# Patient Record
Sex: Male | Born: 1935 | Race: White | Hispanic: No | Marital: Married | Smoking: Former smoker
Health system: Southern US, Community
[De-identification: ages and names within clinical notes are randomized; demographics above are authoritative.]

## PROBLEM LIST (undated history)

## (undated) DIAGNOSIS — I4510 Unspecified right bundle-branch block: Secondary | ICD-10-CM

## (undated) DIAGNOSIS — G709 Myoneural disorder, unspecified: Secondary | ICD-10-CM

## (undated) DIAGNOSIS — I359 Nonrheumatic aortic valve disorder, unspecified: Secondary | ICD-10-CM

## (undated) DIAGNOSIS — F419 Anxiety disorder, unspecified: Secondary | ICD-10-CM

## (undated) DIAGNOSIS — I509 Heart failure, unspecified: Secondary | ICD-10-CM

## (undated) DIAGNOSIS — R0600 Dyspnea, unspecified: Secondary | ICD-10-CM

## (undated) DIAGNOSIS — E039 Hypothyroidism, unspecified: Secondary | ICD-10-CM

## (undated) DIAGNOSIS — K219 Gastro-esophageal reflux disease without esophagitis: Secondary | ICD-10-CM

## (undated) DIAGNOSIS — C801 Malignant (primary) neoplasm, unspecified: Secondary | ICD-10-CM

## (undated) DIAGNOSIS — G20 Parkinson's disease: Secondary | ICD-10-CM

## (undated) DIAGNOSIS — R0609 Other forms of dyspnea: Secondary | ICD-10-CM

## (undated) DIAGNOSIS — G20A1 Parkinson's disease without dyskinesia, without mention of fluctuations: Secondary | ICD-10-CM

## (undated) DIAGNOSIS — I10 Essential (primary) hypertension: Secondary | ICD-10-CM

## (undated) HISTORY — DX: Unspecified right bundle-branch block: I45.10

## (undated) HISTORY — DX: Nonrheumatic aortic valve disorder, unspecified: I35.9

## (undated) HISTORY — DX: Heart failure, unspecified: I50.9

## (undated) HISTORY — PX: PROSTATE SURGERY: SHX751

## (undated) HISTORY — DX: Other forms of dyspnea: R06.09

## (undated) HISTORY — PX: APPENDECTOMY: SHX54

## (undated) HISTORY — DX: Dyspnea, unspecified: R06.00

---

## 1998-07-25 ENCOUNTER — Other Ambulatory Visit: Admission: RE | Admit: 1998-07-25 | Discharge: 1998-07-25 | Payer: Self-pay | Admitting: Internal Medicine

## 2000-05-01 ENCOUNTER — Other Ambulatory Visit: Admission: RE | Admit: 2000-05-01 | Discharge: 2000-05-01 | Payer: Self-pay | Admitting: *Deleted

## 2001-04-14 ENCOUNTER — Encounter (INDEPENDENT_AMBULATORY_CARE_PROVIDER_SITE_OTHER): Payer: Self-pay | Admitting: *Deleted

## 2001-04-15 ENCOUNTER — Inpatient Hospital Stay (HOSPITAL_COMMUNITY): Admission: EM | Admit: 2001-04-15 | Discharge: 2001-04-22 | Payer: Self-pay | Admitting: Emergency Medicine

## 2001-04-15 ENCOUNTER — Encounter: Payer: Self-pay | Admitting: Emergency Medicine

## 2002-06-23 ENCOUNTER — Encounter: Payer: Self-pay | Admitting: Urology

## 2002-06-23 ENCOUNTER — Encounter: Admission: RE | Admit: 2002-06-23 | Discharge: 2002-06-23 | Payer: Self-pay | Admitting: Urology

## 2002-06-23 ENCOUNTER — Ambulatory Visit: Admission: RE | Admit: 2002-06-23 | Discharge: 2002-07-11 | Payer: Self-pay | Admitting: Radiation Oncology

## 2003-03-31 ENCOUNTER — Encounter: Payer: Self-pay | Admitting: Neurology

## 2003-03-31 ENCOUNTER — Encounter: Admission: RE | Admit: 2003-03-31 | Discharge: 2003-03-31 | Payer: Self-pay | Admitting: Neurology

## 2006-08-11 ENCOUNTER — Ambulatory Visit: Payer: Self-pay | Admitting: Internal Medicine

## 2007-10-19 ENCOUNTER — Ambulatory Visit (HOSPITAL_COMMUNITY): Admission: RE | Admit: 2007-10-19 | Discharge: 2007-10-19 | Payer: Self-pay | Admitting: Urology

## 2007-10-21 ENCOUNTER — Ambulatory Visit (HOSPITAL_COMMUNITY): Admission: RE | Admit: 2007-10-21 | Discharge: 2007-10-21 | Payer: Self-pay | Admitting: Urology

## 2007-10-29 ENCOUNTER — Ambulatory Visit: Admission: RE | Admit: 2007-10-29 | Discharge: 2007-12-02 | Payer: Self-pay | Admitting: Radiation Oncology

## 2008-03-21 ENCOUNTER — Ambulatory Visit: Admission: RE | Admit: 2008-03-21 | Discharge: 2008-06-04 | Payer: Self-pay | Admitting: Radiation Oncology

## 2008-07-28 ENCOUNTER — Encounter: Admission: RE | Admit: 2008-07-28 | Discharge: 2008-07-28 | Payer: Self-pay | Admitting: Sports Medicine

## 2010-02-06 ENCOUNTER — Emergency Department (HOSPITAL_COMMUNITY): Admission: EM | Admit: 2010-02-06 | Discharge: 2010-02-06 | Payer: Self-pay | Admitting: Emergency Medicine

## 2010-02-17 ENCOUNTER — Encounter: Admission: RE | Admit: 2010-02-17 | Discharge: 2010-02-17 | Payer: Self-pay | Admitting: Neurology

## 2010-08-27 HISTORY — PX: OTHER SURGICAL HISTORY: SHX169

## 2010-09-18 ENCOUNTER — Encounter
Admission: RE | Admit: 2010-09-18 | Discharge: 2010-09-18 | Payer: Self-pay | Source: Home / Self Care | Attending: Cardiovascular Disease | Admitting: Cardiovascular Disease

## 2010-12-17 LAB — BASIC METABOLIC PANEL
CO2: 26 mEq/L (ref 19–32)
Calcium: 9 mg/dL (ref 8.4–10.5)
Chloride: 102 mEq/L (ref 96–112)
GFR calc Af Amer: 56 mL/min — ABNORMAL LOW (ref >60)
GFR calc non Af Amer: 46 mL/min — ABNORMAL LOW (ref >60)
Sodium: 138 mEq/L (ref 135–145)

## 2010-12-17 LAB — DIFFERENTIAL
Basophils Relative: 1 % (ref 0–1)
Eosinophils Relative: 1 % (ref 0–5)
Monocytes Absolute: 0.3 10*3/uL (ref 0.1–1.0)
Neutro Abs: 4.6 10*3/uL (ref 1.7–7.7)
Neutrophils Relative %: 75 % (ref 43–77)

## 2010-12-17 LAB — POCT CARDIAC MARKERS
CKMB, poc: <1 ng/mL — ABNORMAL LOW (ref 1.0–8.0)
Myoglobin, poc: 84.4 ng/mL (ref 12–200)

## 2010-12-17 LAB — CBC
MCHC: 34.5 g/dL (ref 30.0–36.0)
MCV: 95.8 fL (ref 78.0–100.0)
WBC: 6.1 10*3/uL (ref 4.0–10.5)

## 2010-12-17 LAB — URINALYSIS, ROUTINE W REFLEX MICROSCOPIC: Urobilinogen, UA: 1 mg/dL (ref 0.0–1.0)

## 2011-02-14 NOTE — H&P (Signed)
[facility]  Patient:    Albert Pope, Albert Pope                     MRN: [id number] Adm. Date:  [id number] Attending:  Charlton Haws                         History and Physical  ACCOUNT NO. [id number].  CHIEF COMPLAINT:  Abdominal pain.  HISTORY OF PRESENT ILLNESS:  Albert Pope is a 77 year old gentleman otherwise in reasonably good health, who developed lower abdominal pain this morning. Woke up about 3:30 a.m.  It was across his entire abdomen but mainly the lower half, not the upper half, and did not really seem to be localized more to the right nor the left.  He developed some nausea associated with pain, but most of the pain is dull and cramping.  He has not really been vomiting.  The pain has actually gotten better from what it was earlier this morning.  He was told when Dr. Elder Love was his physician that he had diverticulosis.  The patient has had no urinary tract symptoms.  He had a normal bowel movement yesterday, has not had one the day of admission.  He has never had any significant, serious pains like this current one is.  He has noted, as above, that the pain had begun to ease.  PAST MEDICAL HISTORY:  Hypertension.  PAST SURGICAL HISTORY:  No abdominal procedures.  MEDICATIONS:  Prilosec, Miralax, Tylenol, Motrin, trihexyphenidyl, Diovan, something called Requip, and something else called _____, most of which are for his Parkinsons disease.  CURRENT MEDICAL PRIMARY PHYSICIAN:  Barry Dienes. Eloise Harman, M.D.  REVIEW OF SYSTEMS:  HEENT basically negative.  He has also been told he had _____ syndrome.  CHEST:  No cough, shortness of breath.  HEART:  History of some hypertension.  ABDOMEN:  Negative except for HPI.  GENITOURINARY: Negative.  EXTREMITIES:  Negative.  PHYSICAL EXAMINATION:  VITAL SIGNS:  Blood pressure has been 130/70, pulse of about 100, temperature on two occasions was 98.  GENERAL:  The patient is a healthy  77 year old man.  He does have slight parkinsonian tremor of the right arm.  He is alert and awake and in no acute distress.  HEENT:  Head is normocephalic.  Eyes nonicteric.  Pupils round and regular. Mucous membranes moist.  NECK:  Supple.  No masses or thyromegaly.  CHEST:  Lungs clear to auscultation.  CARDIAC:  Regular.  No murmurs, rubs, or gallops.  ABDOMEN:  Generally soft, but he has some tenderness across the lower abdomen, more noticeable in the right lower quadrant than the left lower quadrant, but there is no rebound, there is no significant guarding currently.  EXTREMITIES:  No cyanosis or edema.  LABORATORY DATA:  White count of 16,000, with a hemoglobin of 17. Electrolytes are basically normal.  Total bilirubin is slightly elevated at 1.7.  Urine shows specific gravity of 1.030, otherwise basically negative.  IMPRESSION:  Abdominal pain.  Probable appendicitis based on the location of his maximum tenderness, but his presentation is a little atypical, and he has more than the usual amount of left lower quadrant pain for appendicitis.  PLAN:  We are going to admit him, get him started on some IV fluids and IV antibiotics.  We will get a CT scan shortly to see if this will better delineate the problem. DD:  04/15/01 TD:  04/15/01 Job: [id number]

## 2011-02-14 NOTE — Assessment & Plan Note (Signed)
[facility]                           GASTROENTEROLOGY OFFICE NOTE   NAME:Blecher, ANTRON SETH                     MRN:          [id number]  DATE:08/11/2006                            DOB:          17-Oct-1924    REASON FOR CONSULTATION:  Diarrhea.   HISTORY:  This is a pleasant 77 year old white male with a history of  Parkinson's disease, hypothyroidism, hypertension, and reflux disease. He  also has a history of adenomatous colon polyps for which he underwent  colonoscopy in 1999 and again in 2003.  He is referred now through the  courtesy of Dr. Sandria Manly regarding diarrhea. In general, the patient has had  normal bowel habits with a tendency towards constipation with some of his  medications. However about 4-5 months ago, coincident with the initiation of  Aricept therapy he began to develop diarrhea. Multiple loose stools per day.  There was no associated nausea, vomiting, cramping or abdominal pain. No  bleeding.  His problem persisted. Several weeks ago it was felt possibly  drug induced and Aricept was discontinued. Since that time he has had  gradual improvement in symptoms. His diarrhea has now resolved.   PAST MEDICAL HISTORY:  As above.   PAST SURGICAL HISTORY:  Appendectomy.   FAMILY HISTORY:  Remarkable for heart disease in his father and brother. No  gastrointestinal malignancy.   SOCIAL HISTORY:  The patient is married with two children. He is accompanied  by his wife, Kathie Rhodes. 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 Does not smoke but does chew tobacco. Does not use  alcohol.   REVIEW OF SYSTEMS:  Per diagnostic evaluation form.   ALLERGIES:  No known drug allergies.   CURRENT MEDICATIONS:  Trihexyphenidyl 2 mg daily, selegiline 5 mg daily,  Requip 2 mg daily, citalopram 40 mg daily, Atenolol 25 mg daily, Omeprazole  20 mg daily, levothyroxine 0.025 mg daily, and baby aspirin 81 mg daily.  Aricept 10 mg daily recently discontinued. He also uses ibuprofen and  Tylenol p.r.n.   PHYSICAL EXAMINATION:  GENERAL:  A well-appearing male in no acute distress.  He alert and oriented. Has an obvious parkinsonian tremor in the right hand.  VITAL SIGNS: Blood pressure is 128/80, heart rate is 60 and regular. Weight  170 pounds, he is 5 feet, 7-1/2 inches in height.  HEENT: Sclerae anicteric. Conjunctivae are pink. Oral mucosa is intact, no  aphthous ulcerations.  LUNGS: Clear.  HEART: Regular.  ABDOMEN: Soft without tenderness, mass or hernia.  EXTREMITIES: Are without edema.   IMPRESSION:  1. Recent problems with persistent diarrhea coincident with the onset of      Aricept therapy. Problem has resolved after discontinuation of the      drug.  The most obvious conclusion is that his problem was drug induced.  1. History of adenomatous colon polyps and incidental diverticulosis. Most      recent colonoscopy in 2003.  2. Gastroesophageal reflux disease without alarm symptoms. On omeprazole.  3. Multiple other general medical problems under the care of Dr. Reuel Boom  [place], and Parkinson's disease under the care of Dr. Sandria Manly.   RECOMMENDATIONS:  At this point I do not feel he needs further work up for  his diarrhea as it has resolved. He is planning to return to Dr. Sandria Manly to  discuss other treatment options for his Parkinson's disease. Certainly if  this is the only reasonable option, then he could be re-challenged with the  drug to see if diarrhea returns, or possibly treated concurrently with  antidiarrheals. However, if an alternative agent is available, then that may  be best. Will leave this to the discretion of Dr. Sandria Manly.     Wilhemina Bonito. Eda Keys., MD  Electronically Signed    JNP/MedQ  DD: 08/12/2006  DT: 08/12/2006  Job #: [id number]   cc:   Barry Dienes. Eloise Harman, M.D.

## 2011-02-14 NOTE — Op Note (Signed)
[facility]  Patient:    Albert Pope, Albert Pope                     MRN: [id number] Proc. Date: 04/15/01 Adm. Date:  [id number] Attending:  Charlton Haws                           Operative Report  PREOPERATIVE DIAGNOSIS:  Acute appendicitis.  POSTOPERATIVE DIAGNOSIS:  Acute appendicitis.  OPERATION PERFORMED:  Laparoscopy followed by open appendectomy.  SURGEON:  Currie Paris, M.D.  ANESTHESIA:  General endotracheal.  CLINICAL HISTORY:  This patient is a ______ year old who presented with abdominal pain which was across the lower abdomen, both right and left side although he had more tenderness on the right side. He had an elevated white count and CT was done which showed acute appendicitis with a fecalith.  DESCRIPTION OF PROCEDURE:  The patient was brought to the operating room and after satisfactory general endotracheal anesthesia had been obtained, the abdomen was prepped and draped. I used 0.25% Marcaine as I made the laparoscopy incision sites. The umbilical incisions were made first, fascia opened and the peritoneal cavity entered under direct vision. The Hasson was introduced and the abdomen insufflated to 15. There was cloudy fluid present along the right gutter and I couldnt really visualize the appendix. There was fairly marked distention of the small bowel.  The 5 mm trocar was placed in the right upper quadrant. Using that, I was able to suction some of the fluid out and could see what appeared to be a markedly inflamed appendix stuck behind the small bowel and stuck laterally to the right pelvic side wall. I thought there might some mobility here so I went ahead and put a 10/11 in the left lateral decubitus position under direct vision. I put the camera in that and tried to mobilize the appendix but it was quite inflamed and too stuck to other tissues to get mobilization and with the distention of the small bowel was unable  to get good visualization and so I decided to do this open. The trocar was removed, the pursestring at the umbilicus tied down at the end of the case.  I made a right lower quadrant incision, divided subcutaneous tissues with the cautery, the anterior sheaths with the cautery, split the muscle layers and entered the peritoneal cavity. I could palpate the appendix but still couldnt get good visualization so I had to extend the incision and open the lateral aspect of the rectus sheath anteriorly a little bit. With this, I was able to mobilize the terminal ileum and cecum up and the appendix came with it. The appendix was densely adherent to the mesentery of the small bowel and this had to be divided with the cautery. I was then able to have the appendix completely freed up. Fragments of what appeared to be an old fecalith had floated up and were retrieved. The mesoappendix was divided between clamps and ligated with 2-0 chromic. The base of the appendix was clamped and ligated with an #0 chromic. The pursestring was placed, the appendix amputated and the stump tied down.  The area was checked for bleeding and appeared to be completely dry. Using the Nezhat irrigator and ______ suction, I was able to suction out the pelvis and right gutter.  The abdomen was then closed using a running #1 PDS on the peritoneum transversalis and then running #0  PDS on the external oblique. The wound was closed with staples using some Telfa wicks to provide drainage.  The patient tolerated the procedure well. There were no operative complications. All counts were correct. DD:  04/15/01 TD:  04/15/01 Job: [id number]

## 2011-02-14 NOTE — Discharge Summary (Signed)
[facility]  Patient:    Albert Pope, Albert Pope                     MRN: [id number] Adm. Date:  [id number] Disc. Date: 04/22/01 Attending:  Charlton Haws                           Discharge Summary  ACCOUNT NUMBER:  [id number]  OFFICE MEDICAL RECORD NUMBER:  [id number]  FINAL DIAGNOSES: 1. Acute suppurative perforated appendicitis. 2. Parkinsons.  CLINICAL HISTORY:  Albert Pope presented to the emergency room late on April 14, 2001, with acute abdominal pain.  It was unclear whether he had diverticulitis or appendicitis.  A CT scan confirmed appendicitis.  HOSPITAL COURSE:  Following admission, he was begun on IV fluids, IV antibiotics, and taken to the operating room.  We attempted to do a laparoscopic appendectomy, but because of the inflammatory process, this was impossible and open appendectomy was performed.  He tolerated the procedure well.  He was a little volume depleted.  His blood pressure drifted down postoperatively, but responded to a bolus of fluid.  The next morning, he felt much better, but his only complaint was a sore mouth.  From that point, he developed a little abdominal distention and x-rays showed dilated small bowel.  An NG was placed and he felt much better with that.  His lungs remained fairly clear.  Labs basically unremarkable.  We had to hold his Parkinsons medicines for a couple of days, but he developed no significant symptoms.  On April 18, 2001, we began to clamp his NG, which he tolerated nicely.  The day, he passed gas and had a bowel movement.  We took his NG out and began him on sips of clear liquids and gradually advanced his diet.  He had a lot of loose stools for a couple of days.  A Clostridium difficile was checked and was negative.  The wound remained clean and without evidence of infection.  Staples were removed on the final day here.  He has been ambulating and tolerating solid diet.  He has remained with  slight abdominal distention, but this is simply thought secondary to the inflammatory process that went on.  LABORATORY DATA:  The pathology report confirmed acute appendicitis.  Blood cultures done during his hospital stay were unremarkable.  His admission hemoglobin was 17.6, but settled down around 14 after rehabilitation.  I think that the elevation was due to volume depletion.  DISCHARGE MEDICATIONS:  He is discharged to resume his usual home medications, given Tylox for pain, and Augmentin 875 mg t.i.d. because of the perforation to continue for another week.  FOLLOW-UP:  To see me in the office on one week.  He knows to call the office if he has any problems with nausea, vomiting, fever, chills, abdominal complaints, wound drainage, etc. DD:  04/22/01 TD:  04/22/01 Job: [id number]

## 2011-07-30 ENCOUNTER — Ambulatory Visit: Payer: Self-pay | Admitting: Physical Therapy

## 2011-08-08 ENCOUNTER — Ambulatory Visit: Payer: Medicare Other | Attending: Neurology | Admitting: Physical Therapy

## 2011-08-08 DIAGNOSIS — G20 Parkinson's disease: Secondary | ICD-10-CM | POA: Insufficient documentation

## 2011-08-08 DIAGNOSIS — G20A1 Parkinson's disease without dyskinesia, without mention of fluctuations: Secondary | ICD-10-CM | POA: Insufficient documentation

## 2011-08-08 DIAGNOSIS — R269 Unspecified abnormalities of gait and mobility: Secondary | ICD-10-CM | POA: Insufficient documentation

## 2011-08-08 DIAGNOSIS — IMO0001 Reserved for inherently not codable concepts without codable children: Secondary | ICD-10-CM | POA: Insufficient documentation

## 2011-08-12 ENCOUNTER — Ambulatory Visit: Payer: Medicare Other | Admitting: Physical Therapy

## 2011-08-19 ENCOUNTER — Ambulatory Visit: Payer: Medicare Other | Admitting: Physical Therapy

## 2011-08-25 ENCOUNTER — Ambulatory Visit: Payer: Medicare Other | Admitting: Physical Therapy

## 2011-08-28 ENCOUNTER — Ambulatory Visit: Payer: Medicare Other | Admitting: Physical Therapy

## 2011-09-01 ENCOUNTER — Ambulatory Visit: Payer: Medicare Other | Admitting: Physical Therapy

## 2011-09-01 HISTORY — PX: OTHER SURGICAL HISTORY: SHX169

## 2011-09-04 ENCOUNTER — Ambulatory Visit: Payer: Medicare Other | Admitting: Physical Therapy

## 2011-09-10 ENCOUNTER — Ambulatory Visit: Payer: Medicare Other | Attending: Neurology | Admitting: Physical Therapy

## 2011-09-10 DIAGNOSIS — R269 Unspecified abnormalities of gait and mobility: Secondary | ICD-10-CM | POA: Insufficient documentation

## 2011-09-10 DIAGNOSIS — IMO0001 Reserved for inherently not codable concepts without codable children: Secondary | ICD-10-CM | POA: Insufficient documentation

## 2011-09-10 DIAGNOSIS — G20 Parkinson's disease: Secondary | ICD-10-CM | POA: Insufficient documentation

## 2011-09-10 DIAGNOSIS — G20A1 Parkinson's disease without dyskinesia, without mention of fluctuations: Secondary | ICD-10-CM | POA: Insufficient documentation

## 2011-10-11 ENCOUNTER — Emergency Department (HOSPITAL_COMMUNITY): Payer: Medicare Other

## 2011-10-11 ENCOUNTER — Emergency Department (HOSPITAL_COMMUNITY)
Admission: EM | Admit: 2011-10-11 | Discharge: 2011-10-11 | Disposition: A | Discharge status: Home / Self Care | Payer: Medicare Other | Attending: Emergency Medicine | Admitting: Emergency Medicine

## 2011-10-11 ENCOUNTER — Encounter (HOSPITAL_COMMUNITY): Payer: Self-pay | Admitting: *Deleted

## 2011-10-11 DIAGNOSIS — R05 Cough: Secondary | ICD-10-CM

## 2011-10-11 DIAGNOSIS — X58XXXA Exposure to other specified factors, initial encounter: Secondary | ICD-10-CM | POA: Insufficient documentation

## 2011-10-11 DIAGNOSIS — R231 Pallor: Secondary | ICD-10-CM | POA: Insufficient documentation

## 2011-10-11 DIAGNOSIS — S335XXA Sprain of ligaments of lumbar spine, initial encounter: Secondary | ICD-10-CM | POA: Insufficient documentation

## 2011-10-11 DIAGNOSIS — G20A1 Parkinson's disease without dyskinesia, without mention of fluctuations: Secondary | ICD-10-CM | POA: Insufficient documentation

## 2011-10-11 DIAGNOSIS — G20 Parkinson's disease: Secondary | ICD-10-CM | POA: Insufficient documentation

## 2011-10-11 DIAGNOSIS — R059 Cough, unspecified: Secondary | ICD-10-CM | POA: Insufficient documentation

## 2011-10-11 HISTORY — DX: Essential (primary) hypertension: I10

## 2011-10-11 HISTORY — DX: Gastro-esophageal reflux disease without esophagitis: K21.9

## 2011-10-11 HISTORY — DX: Parkinson's disease without dyskinesia, without mention of fluctuations: G20.A1

## 2011-10-11 HISTORY — DX: Parkinson's disease: G20

## 2011-10-11 HISTORY — DX: Malignant (primary) neoplasm, unspecified: C80.1

## 2011-10-11 LAB — URINALYSIS, ROUTINE W REFLEX MICROSCOPIC
Glucose, UA: NEGATIVE mg/dL
Hgb urine dipstick: NEGATIVE
Protein, ur: NEGATIVE mg/dL
pH: 5.5 (ref 5.0–8.0)

## 2011-10-11 MED ORDER — IBUPROFEN 100 MG/5ML PO SUSP
ORAL | Status: AC
  Filled 2011-10-11: qty 30

## 2011-10-11 MED ORDER — IBUPROFEN 100 MG/5ML PO SUSP
600.0000 mg | Freq: Once | ORAL | Status: AC
  Administered 2011-10-11: 600 mg via ORAL

## 2011-10-11 MED ORDER — DEXTROMETHORPHAN POLISTIREX 30 MG/5ML PO LQCR: 60.0000 mg | ORAL | Status: AC | PRN

## 2011-10-11 NOTE — ED Provider Notes (Signed)
History     CSN: [id number]  Arrival date & time 10/11/11  1016   First MD Initiated Contact with Patient 10/11/11 1102      Chief Complaint  Patient presents with  . Back Pain    lower rt at belt line    (Consider location/radiation/quality/duration/timing/severity/associated sxs/prior treatment) HPI Comments: Patient reports productive cough for the past 18 days.  No fever, now having bilateral lower posterior rib pain, worse with cough, or certain movements  Patient is a 78 y.o. male presenting with back pain. The history is provided by the patient.  Back Pain  The pain is present in the lumbar spine and thoracic spine. The quality of the pain is described as aching. The pain does not radiate. The pain is at a severity of 4/10. The pain is moderate. The symptoms are aggravated by certain positions. Pertinent negatives include no chest pain, no fever, no abdominal pain, no dysuria and no weakness. He has tried nothing for the symptoms.    Past Medical History  Diagnosis Date  . Parkinson disease   . Cancer   . GERD (gastroesophageal reflux disease)   . Hypertension     Past Surgical History  Procedure Date  . Prostate surgery     No family history on file.  History  Substance Use Topics  . Smoking status: Never Smoker   . Smokeless tobacco: Not on file  . Alcohol Use: No      Review of Systems  Constitutional: Negative for fever, chills, activity change and appetite change.  HENT: Negative for rhinorrhea.   Respiratory: Positive for cough. Negative for shortness of breath and wheezing.   Cardiovascular: Negative for chest pain.  Gastrointestinal: Negative for nausea and abdominal pain.  Genitourinary: Negative for dysuria, frequency and flank pain.  Musculoskeletal: Positive for back pain.  Skin: Positive for pallor.  Neurological: Negative for dizziness and weakness.    Allergies  Review of patient's allergies indicates no known allergies.  Home  Medications   Current Outpatient Rx  Name Route Sig Dispense Refill  . ALPRAZOLAM 0.25 MG PO TBDP Oral Take 0.25 mg by mouth at bedtime as needed.    . ASPIRIN 81 MG PO TABS Oral Take 160 mg by mouth daily.    . ATENOLOL 25 MG PO TABS Oral Take 25 mg by mouth daily.    Marland Kitchen CARBIDOPA-LEVODOPA 25-100 MG PO TABS Oral Take 1 tablet by mouth 3 (three) times daily.    . IBUPROFEN 100 MG PO CHEW Oral Chew 100 mg by mouth every 8 (eight) hours as needed. For pain    . LEVOTHYROXINE SODIUM 25 MCG PO TABS Oral Take 25 mcg by mouth daily.    Marland Kitchen MIRTAZAPINE 30 MG PO TABS Oral Take 30 mg by mouth at bedtime.    . OMEPRAZOLE 20 MG PO CPDR Oral Take 20 mg by mouth daily.    Marland Kitchen DEXTROMETHORPHAN POLISTIREX ER 30 MG/5ML PO LQCR Oral Take 10 mLs (60 mg total) by mouth as needed for cough. 89 mL 0    BP 136/90  Pulse 59  Temp(Src) 97.2 F (36.2 C) (Oral)  SpO2 100%  Physical Exam  Constitutional: He is oriented to person, place, and time. He appears well-developed and well-nourished.  HENT:  Head: Normocephalic.  Eyes: Pupils are equal, round, and reactive to light.  Neck: Normal range of motion.  Cardiovascular: Normal rate and regular rhythm.   Pulmonary/Chest: Breath sounds normal. No respiratory distress. He has no wheezes. He  has no rales. He exhibits tenderness.  Abdominal: Soft. Bowel sounds are normal.  Musculoskeletal: Normal range of motion.  Neurological: He is alert and oriented to person, place, and time.  Skin: Skin is warm and dry. There is pallor.    ED Course  Procedures (including critical care time)   Labs Reviewed  URINALYSIS, ROUTINE W REFLEX MICROSCOPIC   Dg Chest 2 View  10/11/2011  *RADIOLOGY REPORT*  Clinical Data: Cough and congestion  CHEST - 2 VIEW  Comparison: 09/18/2010  Findings: The heart, mediastinum and hila are unremarkable.  Healed granuloma in the left upper lobe lingular segment.  Lungs are otherwise clear.  Bony thorax is demineralized but intact.  No change  from the prior study.  IMPRESSION: No active disease of the chest.  Original Report Authenticated By: Domenic Moras, M.D.   Dg Lumbar Spine Complete  10/11/2011  *RADIOLOGY REPORT*  Clinical Data: Low back pain.  LUMBAR SPINE - COMPLETE 4+ VIEW  Comparison: MR lumbar spine 08/10/2008.  Findings: There is convex right scoliosis as seen on the patient's MRI.  Marked multilevel loss of disc space height and facet degenerative disease are identified.  No fracture or subluxation.  IMPRESSION: Scoliosis and marked degenerative disease.  Original Report Authenticated By: Bernadene Bell. D'ALESSIO, M.D.     1. Low back strain   2. Cough     Sister negative for pneumonia or compression fracture.  Patient was ambulated without difficulty in the hallway.  His urine sample is negative for any sign of infection.  Will increase the amount of ibuprofen.  He takes on a regular basis for the next several days to control his back pain.  Will also increase his cough medication from Robitussin to Delsym.  Symptoms and have patient followup with his primary care physician  MDM  Aspiration pneumonia, muscle train         Arman Filter, NP 10/11/11 1117  Arman Filter, NP 10/11/11 1317

## 2011-10-11 NOTE — ED Notes (Signed)
Pt c/o lower rt back pain, denies urinary symptoms, pain become worse yesterday and today

## 2011-10-12 NOTE — ED Provider Notes (Signed)
Medical screening examination/treatment/procedure(s) were conducted as a shared visit with non-physician practitioner(s) and myself.  I personally evaluated the patient during the encounter  Pt w recent cough, w coughing spells right back pain. No sob. Chest cta. Chest xray neg acute. abd soft nt.   Suzi Roots, MD 10/12/11 [phone]

## 2011-11-25 ENCOUNTER — Other Ambulatory Visit: Payer: Self-pay | Admitting: Dermatology

## 2012-02-08 ENCOUNTER — Emergency Department (HOSPITAL_COMMUNITY): Payer: Medicare Other

## 2012-02-08 ENCOUNTER — Emergency Department (HOSPITAL_COMMUNITY)
Admission: EM | Admit: 2012-02-08 | Discharge: 2012-02-08 | Disposition: A | Discharge status: Home / Self Care | Payer: Medicare Other | Attending: Emergency Medicine | Admitting: Emergency Medicine

## 2012-02-08 DIAGNOSIS — G20A1 Parkinson's disease without dyskinesia, without mention of fluctuations: Secondary | ICD-10-CM | POA: Insufficient documentation

## 2012-02-08 DIAGNOSIS — G319 Degenerative disease of nervous system, unspecified: Secondary | ICD-10-CM | POA: Insufficient documentation

## 2012-02-08 DIAGNOSIS — K219 Gastro-esophageal reflux disease without esophagitis: Secondary | ICD-10-CM | POA: Insufficient documentation

## 2012-02-08 DIAGNOSIS — M542 Cervicalgia: Secondary | ICD-10-CM | POA: Insufficient documentation

## 2012-02-08 DIAGNOSIS — G20 Parkinson's disease: Secondary | ICD-10-CM | POA: Insufficient documentation

## 2012-02-08 DIAGNOSIS — Z79899 Other long term (current) drug therapy: Secondary | ICD-10-CM | POA: Insufficient documentation

## 2012-02-08 DIAGNOSIS — S0001XA Abrasion of scalp, initial encounter: Secondary | ICD-10-CM

## 2012-02-08 DIAGNOSIS — Y92009 Unspecified place in unspecified non-institutional (private) residence as the place of occurrence of the external cause: Secondary | ICD-10-CM | POA: Insufficient documentation

## 2012-02-08 DIAGNOSIS — W19XXXA Unspecified fall, initial encounter: Secondary | ICD-10-CM | POA: Insufficient documentation

## 2012-02-08 DIAGNOSIS — I10 Essential (primary) hypertension: Secondary | ICD-10-CM | POA: Insufficient documentation

## 2012-02-08 DIAGNOSIS — IMO0002 Reserved for concepts with insufficient information to code with codable children: Secondary | ICD-10-CM | POA: Insufficient documentation

## 2012-02-08 LAB — URINALYSIS, ROUTINE W REFLEX MICROSCOPIC
Bilirubin Urine: NEGATIVE
Hgb urine dipstick: NEGATIVE
Ketones, ur: NEGATIVE mg/dL
Nitrite: NEGATIVE
Specific Gravity, Urine: 1.012 (ref 1.005–1.030)
Urobilinogen, UA: 0.2 mg/dL (ref 0.0–1.0)

## 2012-02-08 LAB — POCT I-STAT, CHEM 8
Calcium, Ion: 1.19 mmol/L (ref 1.12–1.32)
Glucose, Bld: 103 mg/dL — ABNORMAL HIGH (ref 70–99)
HCT: 48 % (ref 39.0–52.0)
Hemoglobin: 16.3 g/dL (ref 13.0–17.0)
Potassium: 3.9 mEq/L (ref 3.5–5.1)

## 2012-02-08 MED ORDER — ACETAMINOPHEN 325 MG PO TABS
650.0000 mg | ORAL_TABLET | Freq: Once | ORAL | Status: AC
  Administered 2012-02-08: 650 mg via ORAL
  Filled 2012-02-08: qty 2

## 2012-02-08 NOTE — ED Notes (Signed)
MD/PA at bedside. 

## 2012-02-08 NOTE — ED Notes (Signed)
Pt presents w/ posterior head laceration, c/o neck pain, secondary to fall backwards. Pt takes ASA daily.

## 2012-02-08 NOTE — ED Notes (Addendum)
Pt was found on the floor next to bed on back. Pt c/o of heck and head pain . Pt placed in philly collar and supine oin bed. No N/V

## 2012-02-08 NOTE — ED Provider Notes (Signed)
Medical screening examination/treatment/procedure(s) were conducted as a shared visit with non-physician practitioner(s) and myself.  I personally evaluated the patient during the encounter  Sleeping but arousable. Soft tissue tenderness of neck. RUE tremor.   Hanley Seamen, MD 02/08/12 1233

## 2012-02-08 NOTE — ED Provider Notes (Signed)
History     CSN: [id number]  Arrival date & time 02/08/12  0434   First MD Initiated Contact with Patient 02/08/12 [phone]      Chief Complaint  Patient presents with  . Fall    (Consider location/radiation/quality/duration/timing/severity/associated sxs/prior treatment) HPI Comments: Patient with hx Parkinson's disease presents following a fall in the bathroom tonight.  Patient reports pain in his neck.  Pt does not remember falling.  Daughter reports that when patient wakes up an night to use the bathroom, he walks around the house with his eyes closed and she thinks because he is not fully awake he is more confused than normal.  Wife heard patient fall and found him laying in the bathroom.  States he was awake and refused to go to the hospital when she saw him but eventually changed his mind as the pain in his neck increased.  She also noted he was bleeding from the back of his head.  Patient had a similar fall in the bathroom a few weeks ago, also at night.  Wife states that as of yesterday and last night, patient was in his normal state of health, had no complaints.  Denies fevers, cough, SOB, complaints of chest or abdominal pain.    The history is provided by the spouse, a relative and the patient. The history is limited by the condition of the patient.    Past Medical History  Diagnosis Date  . Parkinson disease   . Cancer   . GERD (gastroesophageal reflux disease)   . Hypertension     Past Surgical History  Procedure Date  . Prostate surgery     No family history on file.  History  Substance Use Topics  . Smoking status: Never Smoker   . Smokeless tobacco: Not on file  . Alcohol Use: No      Review of Systems  Unable to perform ROS: Dementia    Allergies  Review of patient's allergies indicates no known allergies.  Home Medications   Current Outpatient Rx  Name Route Sig Dispense Refill  . ALPRAZOLAM 0.25 MG PO TBDP Oral Take 0.25 mg by mouth at bedtime as  needed.    . ASPIRIN 81 MG PO TABS Oral Take 160 mg by mouth daily.    . ATENOLOL 25 MG PO TABS Oral Take 25 mg by mouth daily.    Marland Kitchen CARBIDOPA-LEVODOPA 25-100 MG PO TABS Oral Take 1 tablet by mouth 3 (three) times daily.    . IBUPROFEN 100 MG PO CHEW Oral Chew 100 mg by mouth every 8 (eight) hours as needed. For pain    . LEVOTHYROXINE SODIUM 25 MCG PO TABS Oral Take 25 mcg by mouth daily.    Marland Kitchen MIRTAZAPINE 30 MG PO TABS Oral Take 30 mg by mouth at bedtime.    . OMEPRAZOLE 20 MG PO CPDR Oral Take 20 mg by mouth daily.      BP 176/99  Pulse 67  Temp(Src) 98.5 F (36.9 C) (Oral)  Resp 18  Ht 5\' 10"  (1.778 m)  Wt 168 lb (76.204 kg)  BMI 24.11 kg/m2  SpO2 96%  Physical Exam  Nursing note and vitals reviewed. Constitutional: He is oriented to person, place, and time. He appears well-developed and well-nourished. No distress.  HENT:  Head: Normocephalic.    Neck: Neck supple.  Cardiovascular: Normal rate and regular rhythm.   Pulmonary/Chest: Effort normal and breath sounds normal. No respiratory distress. He has no wheezes. He has no rales.  Abdominal: Soft. He exhibits no distension. There is no tenderness. There is no rebound and no guarding.  Musculoskeletal: He exhibits no edema and no tenderness.  Neurological: He is alert and oriented to person, place, and time.  Skin: He is not diaphoretic.    ED Course  Procedures (including critical care time)  Labs Reviewed - No data to display Ct Head Wo Contrast  02/08/2012  *RADIOLOGY REPORT*  Clinical Data: Posterior head laceration and neck pain after fall.  CT HEAD WITHOUT CONTRAST  Technique:  Contiguous axial images were obtained from the base of the skull through the vertex without contrast.  Comparison: 02/06/2010  Findings: Diffuse cerebral atrophy.  Patchy low attenuation changes in the deep white matter consistent with small vessel ischemia.  No mass effect or midline shift.  No abnormal extra-axial fluid collections.   Gray-white matter junctions are distinct.  Basal cisterns are not effaced.  No evidence of acute intracranial hemorrhage.  Visualized paranasal sinuses and mastoid air cells are not opacified.  Vascular calcifications.  Tortuous basilar artery. No depressed skull fractures.  No significant change since previous study.  IMPRESSION: Chronic atrophy and small vessel ischemic changes.  No evidence of acute intracranial abnormality.  Original Report Authenticated By: Marlon Pel, M.D.   Ct Cervical Spine Wo Contrast  02/08/2012  *RADIOLOGY REPORT*  Clinical Data: Neck pain after fall  CT CERVICAL SPINE WITHOUT CONTRAST  Technique:  Multidetector CT imaging of the cervical spine was performed. Multiplanar CT image reconstructions were also generated.  Comparison: None.  Findings: Degenerative changes throughout the cervical spine with narrowed cervical interspaces and endplate hypertrophic changes. Degenerative changes in the facet joints and its C1-2.  Coalition of C3-C4 vertebra.  Mild retrolisthesis of C3 on C4 with mild anterolisthesis of C4-C5, likely degenerative.  No vertebral compression deformity.  No prevertebral soft tissue swelling. Lateral masses of C1 are symmetrical.  The odontoid process appears intact with degenerative remodelling.  No prevertebral soft tissue swelling.  No paraspinal soft tissue infiltration.  IMPRESSION: Degenerative changes throughout the cervical spine.  No displaced fractures identified.  Original Report Authenticated By: Marlon Pel, M.D.   6:40 AM Discussed patient with Dr Read Drivers.    Date: 02/08/2012  Rate: 70  Rhythm: normal sinus rhythm  QRS Axis: normal  Intervals: normal  ST/T Wave abnormalities:  nonspecific ST/T wave changes  Conduction Disutrbances:right bundle branch block  Narrative Interpretation:   Old EKG Reviewed: no significant changes EKG discussed with Dr Read Drivers.  Dr Read Drivers has also seen the patient.   1. Fall   2. Neck pain   3.  Scalp abrasion       MDM  Elderly patient with Parkinson disease presenting with unwitnessed fall while walking to the bathroom tonight.  Daughter states that patient is less stable at night and tends to get more confused, also thinks he walks the house with his eyes closed, not fully awake.  I discussed safety precautions with family including possible safety solutions of bedside commode or urinal for night time.  Pt d/c home with wound care, tylenol/ibuprofen for pain, to return for worsening symptoms including infection.  Family verbalizes understanding and agrees with plan.         Dillard Cannon [place], [place] 02/08/12 [phone]

## 2012-02-08 NOTE — Discharge Instructions (Signed)
Read the information.  Consider using a bedside commode or urinal at night as we discussed.  Please follow up with your primary care provider.  You may return to the ER at any time for worsening condition or any new symptoms that concern you.  Abrasions Abrasions are skin scrapes. Their treatment depends on how large and deep the abrasion is. Abrasions do not extend through all layers of the skin. A cut or lesion through all skin layers is called a laceration. HOME CARE INSTRUCTIONS   If you were given a dressing, change it at least once a day or as instructed by your caregiver. If the bandage sticks, soak it off with a solution of water or hydrogen peroxide.   Twice a day, wash the area with soap and water to remove all the cream/ointment. You may do this in a sink, under a tub faucet, or in a shower. Rinse off the soap and pat dry with a clean towel. Look for signs of infection (see below).   Reapply cream/ointment according to your caregiver's instruction. This will help prevent infection and keep the bandage from sticking. Telfa or gauze over the wound and under the dressing or wrap will also help keep the bandage from sticking.   If the bandage becomes wet, dirty, or develops a foul smell, change it as soon as possible.   Only take over-the-counter or prescription medicines for pain, discomfort, or fever as directed by your caregiver.  SEEK IMMEDIATE MEDICAL CARE IF:   Increasing pain in the wound.   Signs of infection develop: redness, swelling, surrounding area is tender to touch, or pus coming from the wound.   You have a fever.   Any foul smell coming from the wound or dressing.  Most skin wounds heal within ten days. Facial wounds heal faster. However, an infection may occur despite proper treatment. You should have the wound checked for signs of infection within 24 to 48 hours or sooner if problems arise. If you were not given a wound-check appointment, look closely at the wound  yourself on the second day for early signs of infection listed above. MAKE SURE YOU:   Understand these instructions.   Will watch your condition.   Will get help right away if you are not doing well or get worse.  Document Released: 06/25/2005 Document Revised: 09/04/2011 Document Reviewed: 08/19/2011 [facility] Patient Information 2012 [place], [place].Abrasions Abrasions are skin scrapes. Their treatment depends on how large and deep the abrasion is. Abrasions do not extend through all layers of the skin. A cut or lesion through all skin layers is called a laceration. HOME CARE INSTRUCTIONS   If you were given a dressing, change it at least once a day or as instructed by your caregiver. If the bandage sticks, soak it off with a solution of water or hydrogen peroxide.   Twice a day, wash the area with soap and water to remove all the cream/ointment. You may do this in a sink, under a tub faucet, or in a shower. Rinse off the soap and pat dry with a clean towel. Look for signs of infection (see below).   Reapply cream/ointment according to your caregiver's instruction. This will help prevent infection and keep the bandage from sticking. Telfa or gauze over the wound and under the dressing or wrap will also help keep the bandage from sticking.   If the bandage becomes wet, dirty, or develops a foul smell, change it as soon as possible.   Only  take over-the-counter or prescription medicines for pain, discomfort, or fever as directed by your caregiver.  SEEK IMMEDIATE MEDICAL CARE IF:   Increasing pain in the wound.   Signs of infection develop: redness, swelling, surrounding area is tender to touch, or pus coming from the wound.   You have a fever.   Any foul smell coming from the wound or dressing.  Most skin wounds heal within ten days. Facial wounds heal faster. However, an infection may occur despite proper treatment. You should have the wound checked for signs of infection within 24  to 48 hours or sooner if problems arise. If you were not given a wound-check appointment, look closely at the wound yourself on the second day for early signs of infection listed above. MAKE SURE YOU:   Understand these instructions.   Will watch your condition.   Will get help right away if you are not doing well or get worse.  Document Released: 06/25/2005 Document Revised: 09/04/2011 Document Reviewed: 08/19/2011 [facility] Patient Information 2012 [place], [place].

## 2012-02-10 LAB — URINE CULTURE: Special Requests: NORMAL

## 2012-02-11 NOTE — ED Notes (Signed)
Results received from [facility].  (+) URNC -> 40,000 colonies -> Klebsiella Pneumoniae.   No abx tx or Rx.  Chart to MD office for review.

## 2012-02-13 NOTE — ED Notes (Signed)
No answer

## 2012-02-13 NOTE — ED Notes (Signed)
Chart reviewed by Georgie Chard PA  For Cipro 250 mg po BID x 3 days #6 need to be called to pharmacy.

## 2012-02-16 ENCOUNTER — Other Ambulatory Visit: Payer: Self-pay | Admitting: Neurosurgery

## 2012-02-17 ENCOUNTER — Encounter (HOSPITAL_COMMUNITY): Payer: Self-pay | Admitting: *Deleted

## 2012-02-18 ENCOUNTER — Inpatient Hospital Stay (HOSPITAL_COMMUNITY): Payer: Medicare Other | Admitting: Anesthesiology

## 2012-02-18 ENCOUNTER — Inpatient Hospital Stay (HOSPITAL_COMMUNITY)
Admission: RE | Admit: 2012-02-18 | Discharge: 2012-02-24 | DRG: 472 | Disposition: A | Discharge status: Home / Self Care | Payer: Medicare Other | Source: Ambulatory Visit | Attending: Neurosurgery | Admitting: Neurosurgery

## 2012-02-18 ENCOUNTER — Encounter (HOSPITAL_COMMUNITY): Payer: Self-pay | Admitting: Anesthesiology

## 2012-02-18 ENCOUNTER — Inpatient Hospital Stay (HOSPITAL_COMMUNITY): Payer: Medicare Other

## 2012-02-18 ENCOUNTER — Encounter (HOSPITAL_COMMUNITY)
Admission: RE | Disposition: A | Discharge status: Home / Self Care | Payer: Self-pay | Source: Ambulatory Visit | Attending: Neurosurgery

## 2012-02-18 DIAGNOSIS — M5020 Other cervical disc displacement, unspecified cervical region: Principal | ICD-10-CM | POA: Diagnosis present

## 2012-02-18 DIAGNOSIS — G3183 Dementia with Lewy bodies: Secondary | ICD-10-CM | POA: Diagnosis present

## 2012-02-18 DIAGNOSIS — F05 Delirium due to known physiological condition: Secondary | ICD-10-CM | POA: Diagnosis not present

## 2012-02-18 DIAGNOSIS — Z7982 Long term (current) use of aspirin: Secondary | ICD-10-CM

## 2012-02-18 DIAGNOSIS — R339 Retention of urine, unspecified: Secondary | ICD-10-CM | POA: Diagnosis not present

## 2012-02-18 DIAGNOSIS — I10 Essential (primary) hypertension: Secondary | ICD-10-CM | POA: Diagnosis present

## 2012-02-18 DIAGNOSIS — Z8546 Personal history of malignant neoplasm of prostate: Secondary | ICD-10-CM

## 2012-02-18 DIAGNOSIS — K219 Gastro-esophageal reflux disease without esophagitis: Secondary | ICD-10-CM | POA: Diagnosis present

## 2012-02-18 DIAGNOSIS — Z87891 Personal history of nicotine dependence: Secondary | ICD-10-CM

## 2012-02-18 DIAGNOSIS — Z79899 Other long term (current) drug therapy: Secondary | ICD-10-CM

## 2012-02-18 DIAGNOSIS — F0280 Dementia in other diseases classified elsewhere without behavioral disturbance: Secondary | ICD-10-CM | POA: Diagnosis present

## 2012-02-18 DIAGNOSIS — E039 Hypothyroidism, unspecified: Secondary | ICD-10-CM | POA: Diagnosis present

## 2012-02-18 HISTORY — DX: Hypothyroidism, unspecified: E03.9

## 2012-02-18 HISTORY — PX: ANTERIOR CERVICAL DECOMP/DISCECTOMY FUSION: SHX1161

## 2012-02-18 HISTORY — DX: Myoneural disorder, unspecified: G70.9

## 2012-02-18 HISTORY — DX: Anxiety disorder, unspecified: F41.9

## 2012-02-18 HISTORY — PX: CERVICAL DISCECTOMY: SHX98

## 2012-02-18 LAB — BASIC METABOLIC PANEL
Calcium: 9.2 mg/dL (ref 8.4–10.5)
Chloride: 102 mEq/L (ref 96–112)
Creatinine, Ser: 1.26 mg/dL (ref 0.50–1.35)
GFR calc Af Amer: 58 mL/min — ABNORMAL LOW (ref >90)
Sodium: 140 mEq/L (ref 135–145)

## 2012-02-18 LAB — CBC
Platelets: 216 10*3/uL (ref 150–400)
RBC: 5.09 MIL/uL (ref 4.22–5.81)
RDW: 13.4 % (ref 11.5–15.5)
WBC: 14.4 10*3/uL — ABNORMAL HIGH (ref 4.0–10.5)

## 2012-02-18 LAB — SURGICAL PCR SCREEN
MRSA, PCR: NEGATIVE
Staphylococcus aureus: POSITIVE — AB

## 2012-02-18 SURGERY — ANTERIOR CERVICAL DECOMPRESSION/DISCECTOMY FUSION 1 LEVEL
Anesthesia: General | Site: Neck | Wound class: Clean

## 2012-02-18 MED ORDER — PANTOPRAZOLE SODIUM 40 MG PO TBEC
40.0000 mg | DELAYED_RELEASE_TABLET | Freq: Every day | ORAL | Status: DC
  Administered 2012-02-19 – 2012-02-24 (×5): 40 mg via ORAL
  Filled 2012-02-18 (×5): qty 1

## 2012-02-18 MED ORDER — HEMOSTATIC AGENTS (NO CHARGE) OPTIME
TOPICAL | Status: DC | PRN
  Administered 2012-02-18: 1 via TOPICAL

## 2012-02-18 MED ORDER — NEOSTIGMINE METHYLSULFATE 1 MG/ML IJ SOLN
INTRAMUSCULAR | Status: DC | PRN
  Administered 2012-02-18: 4 mg via INTRAVENOUS

## 2012-02-18 MED ORDER — GLYCOPYRROLATE 0.2 MG/ML IJ SOLN
INTRAMUSCULAR | Status: DC | PRN
  Administered 2012-02-18: .6 mg via INTRAVENOUS

## 2012-02-18 MED ORDER — CEFAZOLIN SODIUM 1-5 GM-% IV SOLN
INTRAVENOUS | Status: DC | PRN
  Administered 2012-02-18: 1 g via INTRAVENOUS

## 2012-02-18 MED ORDER — SODIUM CHLORIDE 0.9 % IJ SOLN
3.0000 mL | Freq: Two times a day (BID) | INTRAMUSCULAR | Status: DC
  Administered 2012-02-19 – 2012-02-22 (×5): 3 mL via INTRAVENOUS

## 2012-02-18 MED ORDER — PROPOFOL 10 MG/ML IV EMUL
INTRAVENOUS | Status: DC | PRN
  Administered 2012-02-18: 100 mg via INTRAVENOUS

## 2012-02-18 MED ORDER — LIDOCAINE HCL (CARDIAC) 20 MG/ML IV SOLN
INTRAVENOUS | Status: DC | PRN
  Administered 2012-02-18: 80 mg via INTRAVENOUS

## 2012-02-18 MED ORDER — THROMBIN 5000 UNITS EX SOLR
CUTANEOUS | Status: DC | PRN
  Administered 2012-02-18 (×2): 5000 [IU] via TOPICAL

## 2012-02-18 MED ORDER — 0.9 % SODIUM CHLORIDE (POUR BTL) OPTIME
TOPICAL | Status: DC | PRN
  Administered 2012-02-18: 1000 mL

## 2012-02-18 MED ORDER — ACETAMINOPHEN 650 MG RE SUPP: 650.0000 mg | RECTAL | Status: DC | PRN

## 2012-02-18 MED ORDER — THROMBIN 5000 UNITS EX SOLR
OROMUCOSAL | Status: DC | PRN
  Administered 2012-02-18: 17:00:00 via TOPICAL

## 2012-02-18 MED ORDER — SODIUM CHLORIDE 0.9 % IJ SOLN: 3.0000 mL | INTRAMUSCULAR | Status: DC | PRN

## 2012-02-18 MED ORDER — DEXAMETHASONE 4 MG PO TABS
4.0000 mg | ORAL_TABLET | Freq: Four times a day (QID) | ORAL | Status: DC
  Administered 2012-02-18 – 2012-02-21 (×7): 4 mg via ORAL
  Filled 2012-02-18 (×15): qty 1

## 2012-02-18 MED ORDER — SODIUM CHLORIDE 0.9 % IV SOLN: 250.0000 mL | INTRAVENOUS | Status: DC

## 2012-02-18 MED ORDER — MENTHOL 3 MG MT LOZG: 1.0000 | LOZENGE | OROMUCOSAL | Status: DC | PRN

## 2012-02-18 MED ORDER — EPHEDRINE SULFATE 50 MG/ML IJ SOLN
INTRAMUSCULAR | Status: DC | PRN
  Administered 2012-02-18 (×2): 10 mg via INTRAVENOUS

## 2012-02-18 MED ORDER — SODIUM CHLORIDE 0.9 % IV SOLN: INTRAVENOUS | Status: DC

## 2012-02-18 MED ORDER — MUPIROCIN 2 % EX OINT
TOPICAL_OINTMENT | Freq: Once | CUTANEOUS | Status: AC
  Administered 2012-02-18: 12:00:00 via NASAL
  Filled 2012-02-18: qty 22

## 2012-02-18 MED ORDER — CEFAZOLIN SODIUM 1-5 GM-% IV SOLN
INTRAVENOUS | Status: AC
  Filled 2012-02-18: qty 50

## 2012-02-18 MED ORDER — CARBIDOPA-LEVODOPA 25-100 MG PO TABS
1.0000 | ORAL_TABLET | Freq: Three times a day (TID) | ORAL | Status: DC
  Administered 2012-02-18 – 2012-02-19 (×3): 1 via ORAL
  Filled 2012-02-18 (×5): qty 1

## 2012-02-18 MED ORDER — MORPHINE SULFATE 2 MG/ML IJ SOLN
1.0000 mg | INTRAMUSCULAR | Status: DC | PRN
  Administered 2012-02-19: 1 mg via INTRAVENOUS
  Filled 2012-02-18: qty 1

## 2012-02-18 MED ORDER — HYDROCODONE-ACETAMINOPHEN 5-325 MG PO TABS
1.0000 | ORAL_TABLET | ORAL | Status: DC | PRN
  Administered 2012-02-19 – 2012-02-21 (×2): 1 via ORAL
  Filled 2012-02-18 (×2): qty 1

## 2012-02-18 MED ORDER — MIRTAZAPINE 30 MG PO TABS
30.0000 mg | ORAL_TABLET | Freq: Every day | ORAL | Status: DC
  Administered 2012-02-18 – 2012-02-23 (×6): 30 mg via ORAL
  Filled 2012-02-18 (×8): qty 1

## 2012-02-18 MED ORDER — MUPIROCIN 2 % EX OINT
TOPICAL_OINTMENT | CUTANEOUS | Status: AC
  Filled 2012-02-18: qty 22

## 2012-02-18 MED ORDER — LIDOCAINE HCL 4 % MT SOLN
OROMUCOSAL | Status: DC | PRN
  Administered 2012-02-18: 4 mL via TOPICAL

## 2012-02-18 MED ORDER — ACETAMINOPHEN 325 MG PO TABS: 650.0000 mg | ORAL_TABLET | ORAL | Status: DC | PRN

## 2012-02-18 MED ORDER — ONDANSETRON HCL 4 MG/2ML IJ SOLN: 4.0000 mg | INTRAMUSCULAR | Status: DC | PRN

## 2012-02-18 MED ORDER — LEVOTHYROXINE SODIUM 25 MCG PO TABS
25.0000 ug | ORAL_TABLET | Freq: Every day | ORAL | Status: DC
  Administered 2012-02-19 – 2012-02-24 (×5): 25 ug via ORAL
  Filled 2012-02-18 (×7): qty 1

## 2012-02-18 MED ORDER — LACTATED RINGERS IV SOLN
INTRAVENOUS | Status: DC | PRN
  Administered 2012-02-18 (×2): via INTRAVENOUS

## 2012-02-18 MED ORDER — FENTANYL CITRATE 0.05 MG/ML IJ SOLN: 25.0000 ug | INTRAMUSCULAR | Status: DC | PRN

## 2012-02-18 MED ORDER — CEFAZOLIN SODIUM 1-5 GM-% IV SOLN
1.0000 g | Freq: Three times a day (TID) | INTRAVENOUS | Status: AC
  Administered 2012-02-18 – 2012-02-19 (×2): 1 g via INTRAVENOUS
  Filled 2012-02-18 (×2): qty 50

## 2012-02-18 MED ORDER — ROCURONIUM BROMIDE 100 MG/10ML IV SOLN
INTRAVENOUS | Status: DC | PRN
  Administered 2012-02-18: 30 mg via INTRAVENOUS

## 2012-02-18 MED ORDER — DEXAMETHASONE SODIUM PHOSPHATE 4 MG/ML IJ SOLN
4.0000 mg | Freq: Four times a day (QID) | INTRAMUSCULAR | Status: DC
  Administered 2012-02-20 – 2012-02-21 (×2): 4 mg via INTRAVENOUS
  Filled 2012-02-18 (×12): qty 1

## 2012-02-18 MED ORDER — ATENOLOL 25 MG PO TABS
25.0000 mg | ORAL_TABLET | Freq: Every day | ORAL | Status: DC
  Administered 2012-02-19 – 2012-02-24 (×4): 25 mg via ORAL
  Filled 2012-02-18 (×7): qty 1

## 2012-02-18 MED ORDER — PHENOL 1.4 % MT LIQD: 1.0000 | OROMUCOSAL | Status: DC | PRN

## 2012-02-18 MED ORDER — SUFENTANIL CITRATE 50 MCG/ML IV SOLN
INTRAVENOUS | Status: DC | PRN
  Administered 2012-02-18 (×2): 10 ug via INTRAVENOUS

## 2012-02-18 SURGICAL SUPPLY — 54 items
APL SKNCLS STERI-STRIP NONHPOA (GAUZE/BANDAGES/DRESSINGS) ×1
BANDAGE GAUZE ELAST BULKY 4 IN (GAUZE/BANDAGES/DRESSINGS) ×4 IMPLANT
BENZOIN TINCTURE PRP APPL 2/3 (GAUZE/BANDAGES/DRESSINGS) ×2 IMPLANT
BIT DRILL SM SPINE QC 12 (BIT) ×1 IMPLANT
BLADE ULTRA TIP 2M (BLADE) ×2 IMPLANT
BUR BARREL STRAIGHT FLUTE 4.0 (BURR) IMPLANT
BUR MATCHSTICK NEURO 3.0 LAGG (BURR) ×2 IMPLANT
CANISTER SUCTION 2500CC (MISCELLANEOUS) ×2 IMPLANT
CLOTH BEACON ORANGE TIMEOUT ST (SAFETY) ×2 IMPLANT
CONT SPEC 4OZ CLIKSEAL STRL BL (MISCELLANEOUS) ×2 IMPLANT
COVER MAYO STAND STRL (DRAPES) ×2 IMPLANT
DRAPE LAPAROTOMY 100X72 PEDS (DRAPES) ×2 IMPLANT
DRAPE MICROSCOPE LEICA (MISCELLANEOUS) ×2 IMPLANT
DRAPE POUCH INSTRU U-SHP 10X18 (DRAPES) ×2 IMPLANT
DURAPREP 6ML APPLICATOR 50/CS (WOUND CARE) ×2 IMPLANT
ELECT REM PT RETURN 9FT ADLT (ELECTROSURGICAL) ×2
ELECTRODE REM PT RTRN 9FT ADLT (ELECTROSURGICAL) ×1 IMPLANT
GAUZE SPONGE 4X4 16PLY XRAY LF (GAUZE/BANDAGES/DRESSINGS) IMPLANT
GLOVE BIOGEL M 8.0 STRL (GLOVE) ×2 IMPLANT
GLOVE BIOGEL PI IND STRL 7.0 (GLOVE) IMPLANT
GLOVE BIOGEL PI INDICATOR 7.0 (GLOVE) ×1
GLOVE EXAM NITRILE LRG STRL (GLOVE) IMPLANT
GLOVE EXAM NITRILE MD LF STRL (GLOVE) IMPLANT
GLOVE EXAM NITRILE XL STR (GLOVE) IMPLANT
GLOVE EXAM NITRILE XS STR PU (GLOVE) IMPLANT
GLOVE SURG SS PI 7.0 STRL IVOR (GLOVE) ×1 IMPLANT
GOWN BRE IMP SLV AUR LG STRL (GOWN DISPOSABLE) ×1 IMPLANT
GOWN BRE IMP SLV AUR XL STRL (GOWN DISPOSABLE) IMPLANT
GOWN STRL REIN 2XL LVL4 (GOWN DISPOSABLE) IMPLANT
HEAD HALTER (SOFTGOODS) ×1 IMPLANT
HEMOSTAT POWDER KIT SURGIFOAM (HEMOSTASIS) ×2 IMPLANT
KIT BASIN OR (CUSTOM PROCEDURE TRAY) ×2 IMPLANT
KIT ROOM TURNOVER OR (KITS) ×2 IMPLANT
NDL SPNL 22GX3.5 QUINCKE BK (NEEDLE) ×1 IMPLANT
NEEDLE SPNL 22GX3.5 QUINCKE BK (NEEDLE) ×2 IMPLANT
NS IRRIG 1000ML POUR BTL (IV SOLUTION) ×2 IMPLANT
PACK LAMINECTOMY NEURO (CUSTOM PROCEDURE TRAY) ×2 IMPLANT
PATTIES SURGICAL .5 X1 (DISPOSABLE) ×2 IMPLANT
PLATE ANT CERV XTEND 1 LV 16 (Plate) ×1 IMPLANT
PUTTY DBX 1CC (Putty) ×2 IMPLANT
PUTTY DBX 1CC DEPUY (Putty) IMPLANT
RUBBERBAND STERILE (MISCELLANEOUS) ×4 IMPLANT
SCREW XTD VAR 4.2 SELF TAP 12 (Screw) ×4 IMPLANT
SPACER COLONIAL SMALL 8MM (Spacer) ×1 IMPLANT
SPONGE GAUZE 4X4 12PLY (GAUZE/BANDAGES/DRESSINGS) ×2 IMPLANT
SPONGE INTESTINAL PEANUT (DISPOSABLE) ×3 IMPLANT
SPONGE SURGIFOAM ABS GEL SZ50 (HEMOSTASIS) ×2 IMPLANT
STRIP CLOSURE SKIN 1/2X4 (GAUZE/BANDAGES/DRESSINGS) ×2 IMPLANT
SUT VIC AB 3-0 SH 8-18 (SUTURE) ×2 IMPLANT
SYR 20ML ECCENTRIC (SYRINGE) ×2 IMPLANT
TAPE CLOTH SURG 4X10 WHT LF (GAUZE/BANDAGES/DRESSINGS) ×1 IMPLANT
TOWEL OR 17X24 6PK STRL BLUE (TOWEL DISPOSABLE) ×1 IMPLANT
TOWEL OR 17X26 10 PK STRL BLUE (TOWEL DISPOSABLE) ×1 IMPLANT
WATER STERILE IRR 1000ML POUR (IV SOLUTION) ×2 IMPLANT

## 2012-02-18 NOTE — Preoperative (Signed)
Beta Blockers   Reason not to administer Beta Blockers:Not Applicable, pt. Took 5/22

## 2012-02-18 NOTE — Anesthesia Preprocedure Evaluation (Addendum)
Anesthesia Evaluation  Patient identified by MRN, date of birth, ID band Patient awake    Reviewed: Allergy & Precautions, H&P , NPO status , Patient's Chart, lab work & pertinent test results, reviewed documented beta blocker date and time   Airway Mallampati: III TM Distance: >3 FB Neck ROM: Full    Dental No notable dental hx. (+) Dental Advisory Given and Missing   Pulmonary neg pulmonary ROS, former smoker breath sounds clear to auscultation  Pulmonary exam normal       Cardiovascular hypertension, Pt. on home beta blockers and Pt. on medications Rhythm:Regular Rate:Normal     Neuro/Psych Parkinson's Disease negative psych ROS   GI/Hepatic Neg liver ROS, GERD-  Medicated and Controlled,  Endo/Other  Hypothyroidism   Renal/GU negative Renal ROS  negative genitourinary   Musculoskeletal  (+) Arthritis -, Osteoarthritis,    Abdominal   Peds  Hematology negative hematology ROS (+)   Anesthesia Other Findings   Reproductive/Obstetrics                          Anesthesia Physical Anesthesia Plan  ASA: III  Anesthesia Plan: General   Post-op Pain Management:    Induction: Intravenous  Airway Management Planned: Oral ETT and Video Laryngoscope Planned  Additional Equipment:   Intra-op Plan:   Post-operative Plan: Extubation in OR  Informed Consent: I have reviewed the patients History and Physical, chart, labs and discussed the procedure including the risks, benefits and alternatives for the proposed anesthesia with the patient or authorized representative who has indicated his/her understanding and acceptance.   Dental advisory given  Plan Discussed with: CRNA, Anesthesiologist and Surgeon  Anesthesia Plan Comments:        Anesthesia Quick Evaluation

## 2012-02-18 NOTE — Progress Notes (Signed)
Op note [phone]

## 2012-02-18 NOTE — H&P (Signed)
Albert Pope is an 78 y.o. male.   Chief Complaint: neck pain HPI: patient with ahistory of parkinson who fell again last week and since then he has been complaining of neck pain with radiation to shoulders. Had a mri and saw me in my office two days ago and because of findings he is taken to surgery tody.  Past Medical History  Diagnosis Date  . Parkinson disease   . GERD (gastroesophageal reflux disease)   . Hypertension   . Hypothyroidism     takes meds,  . Cancer     prostate    Past Surgical History  Procedure Date  . Prostate surgery   . Appendectomy     Family History  Problem Relation Age of Onset  . Anesthesia problems Neg Hx   . Hypotension Neg Hx   . Malignant hyperthermia Neg Hx   . Pseudochol deficiency Neg Hx    Social History:  reports that he has quit smoking. His smokeless tobacco use includes Chew. He reports that he does not drink alcohol or use illicit drugs.  Allergies: No Known Allergies  Medications Prior to Admission  Medication Sig Dispense Refill  . ALPRAZolam (NIRAVAM) 0.25 MG dissolvable tablet Take 0.25 mg by mouth at bedtime as needed.      Marland Kitchen aspirin 81 MG tablet Take 81 mg by mouth daily.       Marland Kitchen atenolol (TENORMIN) 25 MG tablet Take 25 mg by mouth daily.      . carbidopa-levodopa (SINEMET) 25-100 MG per tablet Take 1 tablet by mouth 3 (three) times daily.      Marland Kitchen ibuprofen (ADVIL,MOTRIN) 100 MG chewable tablet Chew 100 mg by mouth every 8 (eight) hours as needed. For pain      . levothyroxine (SYNTHROID, LEVOTHROID) 25 MCG tablet Take 25 mcg by mouth daily.      . mirtazapine (REMERON) 30 MG tablet Take 30 mg by mouth at bedtime.      Marland Kitchen omeprazole (PRILOSEC) 20 MG capsule Take 20 mg by mouth daily.        Results for orders placed during the hospital encounter of 02/18/12 (from the past 48 hour(s))  BASIC METABOLIC PANEL     Status: Abnormal   Collection Time   02/18/12 12:13 PM      Component Value Range Comment   Sodium 140  135 -  145 (mEq/L)    Potassium 4.0  3.5 - 5.1 (mEq/L)    Chloride 102  96 - 112 (mEq/L)    CO2 29  19 - 32 (mEq/L)    Glucose, Bld 112 (*) 70 - 99 (mg/dL)    BUN 26 (*) 6 - 23 (mg/dL)    Creatinine, Ser 1.61  0.50 - 1.35 (mg/dL)    Calcium 9.2  8.4 - 10.5 (mg/dL)    GFR calc non Af Amer 50 (*) >90 (mL/min)    GFR calc Af Amer 58 (*) >90 (mL/min)   CBC     Status: Abnormal   Collection Time   02/18/12 12:13 PM      Component Value Range Comment   WBC 14.4 (*) 4.0 - 10.5 (K/uL)    RBC 5.09  4.22 - 5.81 (MIL/uL)    Hemoglobin 16.5  13.0 - 17.0 (g/dL)    HCT 09.6  04.5 - 40.9 (%)    MCV 91.4  78.0 - 100.0 (fL)    MCH 32.4  26.0 - 34.0 (pg)    MCHC 35.5  30.0 -  36.0 (g/dL)    RDW 47.8  29.5 - 62.1 (%)    Platelets 216  150 - 400 (K/uL)   SURGICAL PCR SCREEN     Status: Abnormal   Collection Time   02/18/12 12:16 PM      Component Value Range Comment   MRSA, PCR NEGATIVE  NEGATIVE     Staphylococcus aureus POSITIVE (*) NEGATIVE     No results found.  Review of Systems  Constitutional: Negative.   HENT: Positive for neck pain.   Eyes: Positive for double vision.  Respiratory: Negative.   Cardiovascular: Negative.   Gastrointestinal: Negative.   Genitourinary:       Cancer pf prostate  Skin: Negative.   Neurological: Positive for tremors.  Psychiatric/Behavioral: Negative.     Blood pressure 166/80, pulse 64, temperature 97.9 F (36.6 C), temperature source Oral, resp. rate 17, SpO2 96.00%. Physical Exam hent, nl. Neck,pain with movement lateral and extension.   cv, nl. Lungs ,some ronchii. Abdomen,nl extremities, nl . NEURO normal strenght in all 4 extremities ,exxept for some mild weakness of biceps. Dtr,2 plus with no babinski, sensory,nl .  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 Assessment/Plan Decompression and fusion at c56. Patient and wife aware of risks of the  surgery  Rekia Kujala M 02/18/2012, 3:15 PM

## 2012-02-18 NOTE — Transfer of Care (Signed)
Immediate Anesthesia Transfer of Care Note  Patient: Albert Pope  Procedure(s) Performed: Procedure(s) (LRB): ANTERIOR CERVICAL DECOMPRESSION/DISCECTOMY FUSION 1 LEVEL (N/A)  Patient Location: PACU  Anesthesia Type: General  Level of Consciousness: awake and patient cooperative  Airway & Oxygen Therapy: Patient Spontanous Breathing and Patient connected to face mask oxygen  Post-op Assessment: Report given to PACU RN  Post vital signs: Reviewed and stable  Complications: No apparent anesthesia complications

## 2012-02-18 NOTE — Progress Notes (Signed)
c56 decompression and fusion done.

## 2012-02-18 NOTE — Anesthesia Procedure Notes (Signed)
Procedure Name: Intubation Date/Time: 02/18/2012 3:42 PM Performed by: Glendora Score A Pre-anesthesia Checklist: Patient identified, Emergency Drugs available, Suction available and Patient being monitored Patient Re-evaluated:Patient Re-evaluated prior to inductionOxygen Delivery Method: Circle system utilized Preoxygenation: Pre-oxygenation with 100% oxygen Intubation Type: IV induction Ventilation: Mask ventilation without difficulty Grade View: Grade I Tube type: Oral Tube size: 7.5 mm Number of attempts: 1 Airway Equipment and Method: Stylet and Video-laryngoscopy Placement Confirmation: ETT inserted through vocal cords under direct vision,  positive ETCO2 and breath sounds checked- equal and bilateral Tube secured with: Tape Dental Injury: Teeth and Oropharynx as per pre-operative assessment

## 2012-02-18 NOTE — Anesthesia Postprocedure Evaluation (Signed)
  Anesthesia Post-op Note  Patient: Albert Pope  Procedure(s) Performed: Procedure(s) (LRB): ANTERIOR CERVICAL DECOMPRESSION/DISCECTOMY FUSION 1 LEVEL (N/A)  Patient Location: PACU  Anesthesia Type: General  Level of Consciousness: awake  Airway and Oxygen Therapy: Patient Spontanous Breathing  Post-op Pain: mild  Post-op Assessment: Post-op Vital signs reviewed  Post-op Vital Signs: Reviewed  Complications: No apparent anesthesia complications

## 2012-02-18 NOTE — Progress Notes (Signed)
Transferred to 3027 from PACU.  VSS.  Remains confused to place and situation; requires frequent reminders to not get OOB; does recognize family members and interacts appropriately.  Report to [employer].

## 2012-02-19 ENCOUNTER — Encounter (HOSPITAL_COMMUNITY): Payer: Self-pay | Admitting: General Practice

## 2012-02-19 MED ORDER — CARBIDOPA-LEVODOPA 25-100 MG PO TABS
1.0000 | ORAL_TABLET | Freq: Four times a day (QID) | ORAL | Status: DC
  Administered 2012-02-20 – 2012-02-22 (×7): 1 via ORAL
  Filled 2012-02-19 (×13): qty 1

## 2012-02-19 NOTE — Progress Notes (Signed)
OT Cancellation Note  Treatment cancelled today due to medical issues with patient which prohibited therapy - pt. Too confused and lethargic to participate this pm.  Will re-attempt tomorrow.   Boykin Reaper [phone] 02/19/2012, 4:46 PM

## 2012-02-19 NOTE — Progress Notes (Signed)
Patient ID: Albert Pope, male   DOB: 1925-06-23, 78 y.o.   MRN: [id number] No neck pain, no weakness. Confused. Spoke with family

## 2012-02-19 NOTE — Care Management Note (Signed)
    Page 1 of 2   02/25/2012     10:14:00 AM   CARE MANAGEMENT NOTE 02/25/2012  Patient:  Albert Pope, Albert Pope   Account Number:  [id number]  Date Initiated:  02/19/2012  Documentation initiated by:  Onnie Boer  Subjective/Objective Assessment:   PT WAS ADMITTED FOR ACDF     Action/Plan:   PROGRESSION OF CARE AND DISCHARGE PLANNING   Anticipated DC Date:  02/21/2012   Anticipated DC Plan:  HOME W HOME HEALTH SERVICES      DC Planning Services  CM consult      Choice offered to / List presented to:     DME arranged  3-N-1  [facility] - Lavone Nian      DME agency  [facility]     HH arranged  HH-2 PT  HH-1 RN  HH-3 OT  HH-4 NURSE'S AIDE      HH agency  [facility]   Status of service:  Completed, signed off Medicare Important Message given?   (If response is "NO", the following Medicare IM given date fields will be blank) Date Medicare IM given:   Date Additional Medicare IM given:    Discharge Disposition:  HOME W HOME HEALTH SERVICES  Per UR Regulation:  Reviewed for med. necessity/level of care/duration of stay  If discussed at Long Length of Stay Meetings, dates discussed:    Comments:  02/24/12 Onnie Boer, RN, BSN 1013 PT WAS DC'D TO HOME WITH [facility] PT/OT/RN/AIDE/3N1/RW FROM [facility]  02/20/12 Onnie Boer, RN, BSN 1606 PT HAS BEEN UP WITH PT TODAY AND WAS LOOKING WELL, ALL OF HH NEEDS ABOVE HAVE BEEN ARRANGED  02/19/12 Onnie Boer, RN, BSN 1400 PT WAS AT HOME WITH HIS WIFE PTA.  PT WANTS TO GO HOME AT DC, WIFE WILL BE OF ASSISTANCE TO HIM AT THAT TIME.  HH PT/RN/3N1/RW HAS BEEN ARRANGED WITH [facility].  WILL F/U ON DC NEEDS

## 2012-02-19 NOTE — Progress Notes (Signed)
Patient ID: Albert Pope, male   DOB: Sep 27, 1925, 78 y.o.   MRN: [id number] Cervical wise he has no pain. Unable to void

## 2012-02-19 NOTE — Plan of Care (Signed)
Problem: Consults Goal: Diagnosis - Spinal Surgery Cervical Spine Fusion     

## 2012-02-19 NOTE — Op Note (Signed)
Albert Pope, Albert Pope              ACCOUNT NO.:  [id number]  MEDICAL RECORD NO.:  [id number]  LOCATION:  3027                         FACILITY:  [facility]  PHYSICIAN:  Hilda Lias, M.D.   DATE OF BIRTH:  1924-11-03  DATE OF PROCEDURE:  02/18/2012 DATE OF DISCHARGE:                              OPERATIVE REPORT   PREOPERATIVE DIAGNOSES:  C5-6 herniated disk with severe stenosis. Parkinson disease.  POSTOPERATIVE DIAGNOSES:  C5-6 herniated disk with severe stenosis. Parkinson disease.  PROCEDURE:  Anterior C5-6 diskectomy, decompression of the spinal cord, removal of multiple free fragments.  Interbody fusion with cages and plate.  Microscope.  SURGEON:  Hilda Lias, MD  ASSISTANT:  Cristi Loron, MD  CLINICAL HISTORY:  [name] is an 78 year old gentleman who was a history of Parkinson disease.  He has fallen several occasions.  Last week, he fell and since then, he has had quite a bit of pain with any immobilization of the neck.  MRI showed multiple level of degenerative disk disease, but at the level of C5-C6, he has a severe stenosis with a large herniated disk displacing the spinal cord posteriorly.  He came to see me 48 hours ago.  Surgeries was advised as origin.  He and his wife knew the risk of the surgery including possibility of paralysis or no improvement.  They also fully aware that the procedure has nothing to do with his Parkinson disease.  PROCEDURE:  The patient was taken to the OR, and after intubation, the left side of the neck was cleaned with DuraPrep.  Transverse incision was made through the skin, subcutaneous tissue was straight down to cervical spine.  X-rays showed that we were at the level of 5-6.  Then, the anterior ligament, which was calcified had to be drilled.  We entered the disk space.  The disk space was almost completely empty, but once we did retraction, we found that there was no posterior ligament whatsoever.  What we found was  multiple level of free fragment.  Most of the fragment went down into the body of C6.  With the drill, we drilled the posterior part of C5 and C6.  With the microhook, we were able to remove the fragment.  At the end, we had a good decompression of the spinal cord as well as both C6 nerve roots.  Then, a cage of 8-mm height with autograft inside and DBX was inserted followed by a plate using 4 screws.  Lateral cervical spine showed good position of the plate and the graft.  From then, the area was irrigated.  Once we accomplished hemostasis, the wound was closed with Vicryl and Steri-Strips.          ______________________________ Hilda Lias, M.D.    EB/MEDQ  D:  02/18/2012  T:  02/19/2012  Job:  [id number]

## 2012-02-19 NOTE — Clinical Social Work Note (Signed)
Clinical Social Worker received consult for SNF. CSW reviewed chart and discussed with RN during progression. PT is recommending home health services with 24 supervision, which can be provided by family. RNCM is aware and following. CSW is signing off as no further needs identified. Please reconsult if a need arises prior to discharge.   Dede Query, MSW, Theresia Majors [phone]

## 2012-02-19 NOTE — Progress Notes (Signed)
UR COMPLETED  

## 2012-02-19 NOTE — Evaluation (Signed)
Physical Therapy Evaluation Patient Details Name: Albert Pope MRN: [id number] DOB: 05-18-1925 Today's Date: 02/19/2012 Time: 0920-0955 PT Time Calculation (min): 35 min  PT Assessment / Plan / Recommendation Clinical Impression  Pt is 78 y/o male admitted for s/p cervical fusion from recent fall.  Pt with history of Parkinson's disease with history falls.  Pt will benefit from acute PT services to improve overall mobility and gait.  Family wishes to take pt home and will have 24 hour assist.  Limited evaluation due to pt impulsive nature impairing safety and overall confusion (? Medications).    PT Assessment  Patient needs continued PT services    Follow Up Recommendations  Home health PT;Supervision/Assistance - 24 hour    Barriers to Discharge        lEquipment Recommendations  Rolling walker with 5" wheels    Recommendations for Other Services     Frequency Min 5X/week    Precautions / Restrictions Precautions Precautions: Cervical Required Braces or Orthoses: Cervical Brace Cervical Brace: Hard collar   Pertinent Vitals/Pain No c/o pain       Mobility  Transfers Transfers: Sit to Stand;Stand to Sit Sit to Stand: 4: Min assist;From chair/3-in-1;With armrests Stand to Sit: To chair/3-in-1;To elevated surface;4: Min assist Details for Transfer Assistance: (A) to maintain balance and max cues for safety due to impulsive behavior;   Ambulation/Gait Ambulation/Gait Assistance: 1: +2 Total assist Ambulation/Gait: Patient Percentage: 60% Ambulation Distance (Feet): 30 Feet Assistive device: 2 person hand held assist Ambulation/Gait Assistance Details: Attempted to use the RW with one person assist however pt having difficulty following commands to use RW.  Pt tends to lean to the left side during ambulation with shuffle gait and flexed posture. Gait Pattern: Step-through pattern;Decreased stride length;Shuffle;Trunk flexed;Wide base of support    Exercises     PT  Diagnosis: Difficulty walking;Abnormality of gait  PT Problem List: Decreased activity tolerance;Decreased balance;Decreased mobility;Decreased coordination;Decreased knowledge of use of DME;Decreased safety awareness;Decreased knowledge of precautions PT Treatment Interventions: DME instruction;Gait training;Stair training;Therapeutic activities;Functional mobility training;Therapeutic exercise;Balance training;Neuromuscular re-education;Cognitive remediation;Patient/family education   PT Goals Acute Rehab PT Goals PT Goal Formulation: With family Time For Goal Achievement: 02/26/12 Potential to Achieve Goals: Good Pt will go Supine/Side to Sit: with supervision PT Goal: Supine/Side to Sit - Progress: Goal set today Pt will go Sit to Supine/Side: with supervision PT Goal: Sit to Supine/Side - Progress: Goal set today Pt will go Sit to Stand: with supervision PT Goal: Sit to Stand - Progress: Goal set today Pt will go Stand to Sit: with supervision PT Goal: Stand to Sit - Progress: Goal set today Pt will Ambulate: 51 - 150 feet;with supervision;with rolling walker PT Goal: Ambulate - Progress: Goal set today Pt will Go Up / Down Stairs: 1-2 stairs;with min assist;with least restrictive assistive device PT Goal: Up/Down Stairs - Progress: Goal set today  Visit Information  Last PT Received On: 02/19/12 Assistance Needed: +2 (safety)    Subjective Data  Subjective: "I need to go to the bathroom." Patient Stated Goal: Unable to set however family wishes to take pt home   Prior Functioning  Home Living Lives With: Spouse Available Help at Discharge: Family Type of Home: House Home Access: Stairs to enter Secretary/administrator of Steps: 2 Entrance Stairs-Rails: None Home Layout: One level Bathroom Shower/Tub: Walk-in shower;Door [employer] Toilet: Standard Bathroom Accessibility: Yes How Accessible: Accessible via walker Home Adaptive Equipment: Dan Humphreys - four wheeled;Shower chair  with back;Hand-held shower hose Prior Function Level  of Independence: Independent with assistive device(s) Able to Take Stairs?: Yes Driving: No Vocation: Retired Musician: Clinical cytogeneticist  Overall Cognitive Status: Impaired Area of Impairment: Attention;Memory;Safety/judgement;Following commands;Awareness of deficits;Problem solving Arousal/Alertness: Awake/alert Orientation Level: Disoriented to;Place;Time;Situation Behavior During Session: Restless Current Attention Level: Focused Memory: Decreased recall of precautions Following Commands: Follows one step commands inconsistently Safety/Judgement: Decreased awareness of safety precautions;Decreased safety judgement for tasks assessed;Impulsive    Extremity/Trunk Assessment Right Lower Extremity Assessment RLE ROM/Strength/Tone: Within functional levels RLE Coordination: Deficits Left Lower Extremity Assessment LLE ROM/Strength/Tone: Within functional levels LLE Coordination: Deficits Trunk Assessment Trunk Assessment: Kyphotic   Balance    End of Session PT - End of Session Equipment Utilized During Treatment: Gait belt;Cervical collar Activity Tolerance: Other (comment) (Limited by confusion and overall attention to task) Patient left: in chair;with call bell/phone within reach;with family/visitor present Nurse Communication: Mobility status   Albert Pope 02/19/2012, 10:29 AM Albert Pope, PT DPT [phone]

## 2012-02-20 MED ORDER — ALPRAZOLAM 0.25 MG PO TABS
0.2500 mg | ORAL_TABLET | Freq: Once | ORAL | Status: AC
  Administered 2012-02-21: 0.25 mg via ORAL
  Filled 2012-02-20: qty 1

## 2012-02-20 NOTE — Progress Notes (Signed)
Physical Therapy Treatment Patient Details Name: Albert Pope MRN: [id number] DOB: [birth year] Today's Date: 02/20/2012 Time: 4098-1191 PT Time Calculation (min): 24 min  PT Assessment / Plan / Recommendation Comments on Treatment Session  Patient still continues to be confused. Son present throughout session for education. Patient limited by cognitive deficits. Patient is unable focus and is unable to be taught new information at this time as he is unable to recall new info that would be taught that is pertinant to his needs at this time. Had patient ambulate without RW as giving him a new device was not safe at this time. RN made aware not to use RW as it just confuses the patient more and decreases his safety    Follow Up Recommendations  Home health PT;Supervision/Assistance - 24 hour;Other (comment) (Patient may need SNF if cognition remains and issue)    Barriers to Discharge        Equipment Recommendations  Rolling walker with 5" wheels;Other (comment) (patient may not need RW)    Recommendations for Other Services    Frequency Min 5X/week   Plan Discharge plan remains appropriate    Precautions / Restrictions Precautions Precautions: Cervical Precaution Comments: Reviewed cervical precautions with patient son Required Braces or Orthoses: Cervical Brace Cervical Brace: Hard collar;Other (comment) (for comfort)   Pertinent Vitals/Pain Denied pain    Mobility  Bed Mobility Bed Mobility: Supine to Sit;Sit to Supine Supine to Sit: 4: Min assist Sit to Supine: 4: Min guard Details for Bed Mobility Assistance: A to bring trunk up to sitting position. Cues throughout for safety as patient very impulsive and "rushed" Transfers Sit to Stand: 4: Min assist;With upper extremity assist;From bed Stand to Sit: 4: Min assist;With upper extremity assist;To bed Details for Transfer Assistance: A to ensure balance and safety due to impulsive behavior. Initially patient trying to pull  on RW to get off of bed.  Ambulation/Gait Ambulation/Gait Assistance: 4: Min assist Assistive device: 1 person hand held assist Ambulation/Gait Assistance Details: Attempted using RW in the beginning however patient very unsafe requiring more assistance to be safe with walker therefore ambulated without RW and patient much safer requiring less assistance.  Gait Pattern: Step-through pattern;Ataxic    Exercises     PT Diagnosis:    PT Problem List:   PT Treatment Interventions:     PT Goals Acute Rehab PT Goals PT Goal: Supine/Side to Sit - Progress: Progressing toward goal PT Goal: Sit to Supine/Side - Progress: Progressing toward goal PT Goal: Sit to Stand - Progress: Progressing toward goal PT Goal: Stand to Sit - Progress: Progressing toward goal PT Goal: Ambulate - Progress: Progressing toward goal  Visit Information  Last PT Received On: 02/20/12 Assistance Needed: +1    Subjective Data      Cognition  Overall Cognitive Status: Impaired Area of Impairment: Attention;Memory;Safety/judgement;Following commands;Awareness of deficits Arousal/Alertness: Awake/alert Orientation Level: Disoriented to;Place Behavior During Session: Restless Current Attention Level: Focused Memory: Decreased recall of precautions Following Commands: Follows one step commands inconsistently Safety/Judgement: Decreased awareness of safety precautions;Decreased safety judgement for tasks assessed;Impulsive    Balance     End of Session PT - End of Session Equipment Utilized During Treatment: Gait belt;Cervical collar Activity Tolerance: Patient tolerated treatment well Patient left: in bed;with call bell/phone within reach;with bed alarm set Nurse Communication: Mobility status    Fredrich Birks 02/20/2012, 2:14 PM 02/20/2012 Fredrich Birks PTA [phone] pager [phone] office

## 2012-02-20 NOTE — Progress Notes (Signed)
Patient ID: Albert Pope, male   DOB: 1924-11-04, 78 y.o.   MRN: [id number] mentgally much better. Awake, no pain ,no weakness. Spoke with him, wife and son. We are going to remove the foley and see how well he does. If he can not void .\,he can go home with thew catheter and we will make an appointment with dr Allena Katz)

## 2012-02-20 NOTE — Evaluation (Signed)
Occupational Therapy Evaluation Patient Details Name: Albert Pope MRN: [id number] DOB: [birth year] Today's Date: 02/20/2012 Time: 1334-1400 OT Time Calculation (min): 26 min  OT Assessment / Plan / Recommendation Clinical Impression  This 78 y.o. admitted for C5-6 decompression and fusion due to recent fall.  Pt. with history of Parkinson's.  Wife reports that pt experienced confusion since fall, but it has worsened significantly post surgery.  At this time, pt. demonstrates significant deficits with cognition that impair his abillity to perform all ADL activities.  He also demonstrates impaired balance.  Pt. will need close 24 hour assist/supervision.  Pt. is very impulsive.  Wife feels she can handle pt at home, but she has not demonstrated this yet with OT.  Would optimally recommend second person with them at home due to his level of confusion.  Recommend HHOT at discharge    OT Assessment  Patient needs continued OT Services    Follow Up Recommendations  Home health OT;Supervision/Assistance - 24 hour    Barriers to Discharge None    Equipment Recommendations  Rolling walker with 5" wheels;Other (comment);3 in 1 bedside comode (patient may not need RW)    Recommendations for Other Services    Frequency  Min 2X/week    Precautions / Restrictions Precautions Precautions: Cervical Precaution Comments: Pt. wife instructed in cervical precautions Required Braces or Orthoses: Cervical Brace Cervical Brace: Hard collar;Other (comment) (for comfort)       ADL  Eating/Feeding: Simulated;Minimal assistance Where Assessed - Eating/Feeding: Bed level Grooming: Performed;Wash/dry hands;Wash/dry face;Teeth care;Minimal assistance Where Assessed - Grooming: Unsupported standing Upper Body Bathing: Simulated;Moderate assistance Where Assessed - Upper Body Bathing: Unsupported sitting Lower Body Bathing: Simulated;Moderate assistance Where Assessed - Lower Body Bathing: Unsupported sit  to stand Upper Body Dressing: Simulated;Minimal assistance Where Assessed - Upper Body Dressing: Unsupported sitting Lower Body Dressing: Minimal assistance;Performed (socks) Where Assessed - Lower Body Dressing: Unsupported sit to stand Toilet Transfer: Performed;Minimal assistance Toilet Transfer Method: Sit to stand Toilet Transfer Equipment: Comfort height toilet Toileting - Clothing Manipulation and Hygiene: Performed;Minimal assistance Where Assessed - Engineer, mining and Hygiene: Standing Equipment Used:  (cervical collar) Transfers/Ambulation Related to ADLs: Pt. ambulates with min A due to severity of confusion.  mild LOB ADL Comments: Pt. very confused.  Pt. requires step by step instruction to perform simple grooming tasks.  Pt. indicated he had to use BR. Pt. ambulated into BR, tore toilet paper off roll, and proceeded to wipe his behind, then throw the paper on the floor, and attempted to leave the BR.  Assist required to have pt. actually sit on the commode.      OT Diagnosis: Generalized weakness;Cognitive deficits  OT Problem List: Impaired balance (sitting and/or standing);Decreased cognition;Decreased safety awareness;Decreased knowledge of use of DME or AE;Decreased knowledge of precautions OT Treatment Interventions: Self-care/ADL training;DME and/or AE instruction;Therapeutic activities;Cognitive remediation/compensation;Balance training;Patient/family education   OT Goals Acute Rehab OT Goals OT Goal Formulation: With family Time For Goal Achievement: 02/27/12 Potential to Achieve Goals: Good ADL Goals Pt Will Perform Upper Body Bathing: with set-up;Sitting, edge of bed;Sitting, chair ADL Goal: Upper Body Bathing - Progress: Goal set today Pt Will Perform Lower Body Bathing: with supervision;Sit to stand from bed;Sit to stand from chair ADL Goal: Lower Body Bathing - Progress: Goal set today Pt Will Perform Upper Body Dressing: with set-up;Sitting,  chair;Sitting, bed ADL Goal: Upper Body Dressing - Progress: Goal set today Pt Will Perform Lower Body Dressing: with supervision;Sit to stand from chair;Sit to stand  from bed ADL Goal: Lower Body Dressing - Progress: Goal set today Pt Will Transfer to Toilet: with supervision;Ambulation;Comfort height toilet ADL Goal: Toilet Transfer - Progress: Goal set today Pt Will Perform Toileting - Clothing Manipulation: with supervision;Standing ADL Goal: Toileting - Clothing Manipulation - Progress: Goal set today Pt Will Perform Tub/Shower Transfer: Shower transfer;with min assist;Ambulation;Shower seat without back ADL Goal: Web designer - Progress: Goal set today Additional ADL Goal #1: Wife/family will demonstrate ability to safely assist pt. with BADLs and understand the need for 24 hour direct supervision for safety ADL Goal: Additional Goal #1 - Progress: Goal set today  Visit Information  Last OT Received On: 02/20/12 Assistance Needed: +1    Subjective Data  Subjective: "I've got to service it" Patient Stated Goal: Pt unable due to confusion.  Wife hoping to discharge home   Prior Functioning  Home Living Lives With: Spouse Available Help at Discharge: Family Type of Home: House Home Access: Stairs to enter Secretary/administrator of Steps: 2 Entrance Stairs-Rails: None Home Layout: One level Bathroom Shower/Tub: Walk-in shower;Door [employer] Toilet: Standard Bathroom Accessibility: Yes How Accessible: Accessible via walker Home Adaptive Equipment: Dan Humphreys - four wheeled;Shower chair with back;Hand-held shower hose Prior Function Level of Independence: Independent with assistive device(s) Able to Take Stairs?: Yes Driving: No Vocation: Retired Comments: Pt's wife indicates pt. had demonstrated confusion since fall that she contributes to pain Communication Communication: HOH Dominant Hand: Right    Cognition  Overall Cognitive Status: Impaired Area of Impairment:  Attention;Memory;Safety/judgement;Following commands;Awareness of deficits Arousal/Alertness: Awake/alert Orientation Level: Disoriented to;Place;Time;Situation Behavior During Session: Restless Current Attention Level: Sustained (10-15 seconds) Memory: Decreased recall of precautions Memory Deficits: Memory deficts likely due to attentional deficits Following Commands: Follows one step commands inconsistently Safety/Judgement: Decreased awareness of safety precautions;Decreased safety judgement for tasks assessed;Impulsive    Extremity/Trunk Assessment Right Upper Extremity Assessment RUE ROM/Strength/Tone: Within functional levels RUE Coordination: WFL - gross/fine motor Left Upper Extremity Assessment LUE ROM/Strength/Tone: Within functional levels LUE Coordination: WFL - gross/fine motor Trunk Assessment Trunk Assessment: Kyphotic   Mobility Bed Mobility Bed Mobility: Supine to Sit;Sit to Supine Supine to Sit: 4: Min assist Sit to Supine: 4: Min guard Details for Bed Mobility Assistance: A to bring trunk up to sitting position. Cues throughout for safety as patient very impulsive and "rushed" Transfers Transfers: Sit to Stand;Stand to Sit Sit to Stand: 4: Min assist;With upper extremity assist;From bed Stand to Sit: 4: Min assist;With upper extremity assist;To bed Details for Transfer Assistance: Pt. very impulsive.  Pt. with LOB   Exercise    Balance    End of Session OT - End of Session Equipment Utilized During Treatment: Cervical collar Activity Tolerance:  (limited by confusion) Patient left: in bed;with call bell/phone within reach;with bed alarm set;with family/visitor present Nurse Communication: Other (comment);Mobility status (confusion present pre-surgery)   Nayla Dias M 02/20/2012, 3:08 PM

## 2012-02-20 NOTE — Progress Notes (Signed)
Agree with updated plan  02/20/2012 Milana Kidney DPT PAGER: [phone] OFFICE: [phone]

## 2012-02-20 NOTE — Progress Notes (Signed)
Dr Jeral Fruit states that aspen collar is for comfort only

## 2012-02-21 LAB — URINALYSIS, ROUTINE W REFLEX MICROSCOPIC
Bilirubin Urine: NEGATIVE
Ketones, ur: NEGATIVE mg/dL
Nitrite: NEGATIVE
Protein, ur: NEGATIVE mg/dL
pH: 5.5 (ref 5.0–8.0)

## 2012-02-21 LAB — BASIC METABOLIC PANEL
BUN: 30 mg/dL — ABNORMAL HIGH (ref 6–23)
Calcium: 9.5 mg/dL (ref 8.4–10.5)
Chloride: 105 mEq/L (ref 96–112)
Creatinine, Ser: 1.16 mg/dL (ref 0.50–1.35)
GFR calc Af Amer: 64 mL/min — ABNORMAL LOW (ref >90)

## 2012-02-21 MED ORDER — ALPRAZOLAM 0.25 MG PO TABS
0.2500 mg | ORAL_TABLET | Freq: Once | ORAL | Status: AC
  Administered 2012-02-21: 0.25 mg via ORAL
  Filled 2012-02-21: qty 1

## 2012-02-21 NOTE — Progress Notes (Addendum)
Pt remains awake, confused, irritable and impulsive. Pt keeps on trying to get out of bed and remains restless. Family requesting if we can get something to help pt remain calm. On-call Dr. Yetta Barre notified again and informed pt having xanax as part of his scheduled home meds along with the doses; Dr. ordered another one time dose of 0.25mg  xanax saying he's not going to put pt to sleep. Pt adm medication, will continue to monitor pt and his behavior.   0340: pt administered medication, now calmed and sleeping quietly in bed with family at bedside. Will continue to monitor pt quietly.

## 2012-02-21 NOTE — Progress Notes (Addendum)
Pt is confused, irritable, impulsive and restless. Pt keeps on getting out of bed, family at bedside express concern about pt and wants to know if something can be done to calm him down. On-call Dr. Yetta Barre notified and one time order for 0.25mg  xanax dose received. Will administer medication and continue to monitor quietly.      0115am: Pt calmed and sleeping quietly in room. Medication effective and will continue to monitor quietly.

## 2012-02-21 NOTE — Progress Notes (Signed)
[facility], OTR/L  [phone] 02/21/2012

## 2012-02-21 NOTE — Progress Notes (Signed)
Patient ID: Albert Pope, male   DOB: Sep 26, 1925, 78 y.o.   MRN: [id number] Patient became quite agitated overnight. He has a history of Parkinson's disease and mild dementia. Sounds like he was probably sundowning or had delirium. He also had urinary retention we placed a Foley catheter this morning, he is now somnolent he keeps his eyes closed. Moans to painful stimuli and localizes. We'll check some labs and the urinalysis, but I suspect this is all delirium from his advanced age, Parkinson's, dementia, and recent surgery.

## 2012-02-21 NOTE — Progress Notes (Signed)
PT/OT Cancellation Note  Treatment cancelled today due to pt's delerium. Spoke with RN regarding pt's behavior which has been erratic today and belligerent. RN suggested that PT/OT is not appropriate today.  02/21/2012 Milana Kidney DPT PAGER: [phone] OFFICE: [phone]    Milana Kidney 02/21/2012, 3:49 PM

## 2012-02-22 MED ORDER — CARBIDOPA-LEVODOPA 25-100 MG PO TABS
1.5000 | ORAL_TABLET | ORAL | Status: DC
  Administered 2012-02-22 – 2012-02-24 (×10): 1.5 via ORAL
  Filled 2012-02-22 (×14): qty 1.5

## 2012-02-22 NOTE — Progress Notes (Signed)
Occupational Therapy Treatment Patient Details Name: Albert Pope MRN: [id number] DOB: November 14, 1924 Today's Date: 02/22/2012 Time: 8099-8338 OT Time Calculation (min): 42 min  OT Assessment / Plan / Recommendation Comments on Treatment Session Improved cognition today, however still confused. Impulsive. Poor awareness of surrounding  And safety. Pt is not safe to D/C home with family tomorrow due to cognitive status. Feel that pt will benefit more from D/Cing home with family Tues or Wenesday then continue with [facility] services. Family is able to provide 24/7 supervision. Wife will have support of daughter in law to assist during the day.     Follow Up Recommendations  Home health OT;Supervision/Assistance - 24 hour    Barriers to Discharge   none    Equipment Recommendations  Rolling walker with 5" wheels;Other (comment);3 in 1 bedside comode    Recommendations for Other Services  SW to communicate available resources to assist with supportive care. Palliative care consult.  Frequency Min 2X/week   Plan Discharge plan remains appropriate    Precautions / Restrictions Precautions Precautions: Cervical Precaution Comments: Reviewed precautions with patient, son and wife.  Required Braces or Orthoses: Cervical Brace Cervical Brace: Hard collar   Pertinent Vitals/Pain     ADL  Toilet Transfer: Simulated;Minimal assistance (impulsive and unsafe) Toilet Transfer Method: Sit to stand Toilet Transfer Equipment: Bedside commode;Other (comment) (over toilet) Toileting - Clothing Manipulation and Hygiene: Performed;Moderate assistance Where Assessed - Toileting Clothing Manipulation and Hygiene: Standing Transfers/Ambulation Related to ADLs: Mod A at times due to apparent confusion. Leaning L. Impulsive during ambulation. Bumping into items with walker with poor awareness of surroundings. ADL Comments: Wife and son present for session. Discussed safety issues regarding ADL and mobility.      OT Diagnosis:    OT Problem List:   OT Treatment Interventions:     OT Goals Acute Rehab OT Goals OT Goal Formulation: With family Time For Goal Achievement: 02/27/12 Potential to Achieve Goals: Good ADL Goals Pt Will Perform Upper Body Bathing: with set-up;Sitting, edge of bed;Sitting, chair ADL Goal: Upper Body Bathing - Progress: Progressing toward goals Pt Will Perform Lower Body Bathing: with supervision;Sit to stand from bed;Sit to stand from chair ADL Goal: Lower Body Bathing - Progress: Progressing toward goals Pt Will Perform Upper Body Dressing: with set-up;Sitting, chair;Sitting, bed ADL Goal: Upper Body Dressing - Progress: Progressing toward goals Pt Will Perform Lower Body Dressing: with supervision;Sit to stand from chair;Sit to stand from bed ADL Goal: Lower Body Dressing - Progress: Progressing toward goals Pt Will Transfer to Toilet: with supervision;Ambulation;Comfort height toilet ADL Goal: Toilet Transfer - Progress: Progressing toward goals Pt Will Perform Toileting - Clothing Manipulation: with supervision;Standing ADL Goal: Toileting - Clothing Manipulation - Progress: Progressing toward goals Pt Will Perform Tub/Shower Transfer: Shower transfer;with min assist;Ambulation;Shower seat without back ADL Goal: Web designer - Progress: Progressing toward goals Additional ADL Goal #1: Wife/family will demonstrate ability to safely assist pt. with BADLs and understand the need for 24 hour direct supervision for safety ADL Goal: Additional Goal #1 - Progress: Progressing toward goals  Visit Information  Last OT Received On: 02/22/12 Assistance Needed: +1    Subjective Data      Prior Functioning       Cognition  Overall Cognitive Status: Impaired Area of Impairment: Attention;Memory;Safety/judgement;Following commands;Awareness of deficits Arousal/Alertness: Awake/alert Orientation Level: Place;Situation;Person Behavior During Session: Other  (comment) (impulsive) Memory: Decreased recall of precautions Memory Deficits: Memory deficts likely due to attentional deficits Following Commands: Follows multi-step commands inconsistently  Safety/Judgement: Impulsive;Decreased safety judgement for tasks assessed;Decreased awareness of safety precautions;Decreased awareness of need for assistance    Mobility Bed Mobility Bed Mobility: Supine to Sit Rolling Right: 4: Min guard Right Sidelying to Sit: 4: Min assist;Other (comment) (vc for cervical precautions) Sitting - Scoot to Edge of Bed: 5: Supervision Details for Bed Mobility Assistance: VC for sequencing. Pt required repeating cueing for safe sequencing. Assist through trunk Transfers Transfers: Sit to Stand;Stand to Sit Sit to Stand: 4: Min assist;From bed;With upper extremity assist;Other (comment) (vc for hand placement) Stand to Sit: 4: Min assist;With upper extremity assist;To chair/3-in-1 Details for Transfer Assistance: VC for hand placement. Pt extremely impulsive and unsafe during movements. Instructed pt and family on need to slow down and safely complete transfers   Exercises    Balance  min  - mod A  End of Session OT - End of Session Equipment Utilized During Treatment: Cervical collar;Gait belt Activity Tolerance: Patient limited by fatigue Patient left: in chair;with call bell/phone within reach;with family/visitor present Nurse Communication: Mobility status;Other (comment) (confusion and D/C plan)   Moataz Tavis,HILLARY 02/22/2012, 10:43 AM Luisa Dago, OTR/L  [phone] 02/22/2012

## 2012-02-22 NOTE — Progress Notes (Signed)
Patient ID: Albert Pope, male   DOB: 19-May-1925, 79 y.o.   MRN: [id number] Doing better today less confused less agitated he is awake alert she's oriented strength appears to be symmetric at 4+ out of 5 his wound is clean and dry. Continue to mobilize with physical outpatient therapy apparently with therapy she'll need some assistance and is not rated discharged home. He doesn't catheter in place at this point secondary to urinary retention we'll continue this for a few more days.

## 2012-02-22 NOTE — Progress Notes (Signed)
Physical Therapy Treatment Patient Details Name: Albert Pope MRN: [id number] DOB: 1924/09/30 Today's Date: 02/22/2012 Time: 7829-5621 PT Time Calculation (min): 31 min  PT Assessment / Plan / Recommendation Comments on Treatment Session  Pt still extremely impuslive throughout treatment requiring max cues for safety throughout. Spoke with pt and family regarding safety at home with 24 hr supervision and probable increased assistance for safety. Continue to monitor to see if pt's cognition improves and with that safety. For now, recommending 24 hr supervision at home and family is agreeable and willing    Follow Up Recommendations  Home health PT;Supervision/Assistance - 24 hour    Barriers to Discharge        Equipment Recommendations  Rolling walker with 5" wheels;Other (comment);3 in 1 bedside comode    Recommendations for Other Services    Frequency Min 5X/week   Plan Discharge plan remains appropriate    Precautions / Restrictions Precautions Precautions: Cervical Precaution Comments: Reviewed precautions with patient, son and wife.  Required Braces or Orthoses: Cervical Brace Cervical Brace: Hard collar       Mobility  Bed Mobility Bed Mobility: Rolling Right;Right Sidelying to Sit;Sitting - Scoot to Edge of Bed Rolling Right: 4: Min guard Right Sidelying to Sit: 4: Min assist Sitting - Scoot to Edge of Bed: 5: Supervision Details for Bed Mobility Assistance: VC for sequencing. Pt required repeating cueing for safe sequencing. Assist through trunk Transfers Transfers: Sit to Stand;Stand to Sit Sit to Stand: 4: Min assist;With upper extremity assist;From bed;From chair/3-in-1 Stand to Sit: 4: Min assist;With upper extremity assist;To chair/3-in-1 Details for Transfer Assistance: VC for hand placement. Pt extremely impulsive and unsafe during movements. Instructed pt and family on need to slow down and safely complete transfers Ambulation/Gait Ambulation/Gait  Assistance: 4: Min assist Ambulation Distance (Feet): 100 Feet Assistive device: Rolling walker Ambulation/Gait Assistance Details: VC throughout for safe distance and sequencing with RW. Pt able to comprehend safety with RW better today. Cueing throughout for upright head posture as pt ambulates with forward head neck flexed. Increased cueing for safety and to avoid obstacles Gait Pattern: Step-through pattern;Lateral trunk lean to left;Trunk flexed;Narrow base of support;Decreased stride length Gait velocity: decreased gait speed    Exercises     PT Diagnosis:    PT Problem List:   PT Treatment Interventions:     PT Goals Acute Rehab PT Goals PT Goal Formulation: With family PT Goal: Supine/Side to Sit - Progress: Progressing toward goal PT Goal: Sit to Supine/Side - Progress: Progressing toward goal PT Goal: Sit to Stand - Progress: Progressing toward goal PT Goal: Stand to Sit - Progress: Progressing toward goal PT Goal: Ambulate - Progress: Progressing toward goal  Visit Information  Last PT Received On: 02/22/12 Assistance Needed: +1    Subjective Data      Cognition  Overall Cognitive Status: Impaired Area of Impairment: Attention;Memory;Safety/judgement;Following commands;Awareness of deficits Arousal/Alertness: Awake/alert Orientation Level: Place Behavior During Session: [facility] for tasks performed Memory: Decreased recall of precautions Memory Deficits: Memory deficts likely due to attentional deficits Following Commands: Follows multi-step commands inconsistently Safety/Judgement: Decreased awareness of safety precautions;Impulsive;Decreased awareness of need for assistance;Decreased safety judgement for tasks assessed    Balance     End of Session PT - End of Session Equipment Utilized During Treatment: Gait belt;Cervical collar Activity Tolerance: Patient tolerated treatment well Patient left: in chair;with call bell/phone within reach;with family/visitor  present Nurse Communication: Mobility status    Milana Kidney 02/22/2012, 10:03 AM

## 2012-02-23 NOTE — Progress Notes (Signed)
Patients family refused new IV site.

## 2012-02-23 NOTE — Progress Notes (Signed)
Physical Therapy Treatment Patient Details Name: Albert Pope MRN: [id number] DOB: October 16, 1924 Today's Date: 02/23/2012 Time: 4098-1191 PT Time Calculation (min): 41 min  PT Assessment / Plan / Recommendation Comments on Treatment Session  Spoke in length with patients wife and son reguarding safety at home. Patients wife educated on importance of supervision and assistance that will be needed once at home. Family instructed on using cues that are concise and simple for patient to follow as it is very easy for him to get overloaded with instructions. family also spent time discussing possible need for chair alarm and rails on steps at home. Will need to attempt stairs prior to DC home.     Follow Up Recommendations  Home health PT;Supervision/Assistance - 24 hour    Barriers to Discharge        Equipment Recommendations  3 in 1 bedside comode;Rolling walker with 5" wheels    Recommendations for Other Services    Frequency Min 5X/week   Plan Discharge plan remains appropriate    Precautions / Restrictions Precautions Precautions: Cervical   Pertinent Vitals/Pain     Mobility  Bed Mobility Rolling Right: 4: Min guard Supine to Sit: 4: Min guard;HOB flat;With rails Sitting - Scoot to Edge of Bed: 4: Min guard Details for Bed Mobility Assistance: Min Guard for safety Transfers Sit to Stand: 4: Min assist;With upper extremity assist;With armrests;From chair/3-in-1;From bed Stand to Sit: 4: Min guard;To bed;With armrests;To chair/3-in-1 Details for Transfer Assistance: Patient continues to be impulsive. Cues for safety and technique. Patient stood a total of 5 times throughout session Ambulation/Gait Ambulation/Gait Assistance: 4: Min assist Ambulation Distance (Feet): 210 Feet Assistive device: Rolling walker Ambulation/Gait Assistance Details: VCs and tactile cues for safe positioning and hand placenent of RW. Patient with good proximity to RW after being cued. A for balance.  Patient with forward neck flexion throughout Gait Pattern: Step-through pattern;Lateral trunk lean to left;Narrow base of support;Decreased stride length    Exercises     PT Diagnosis:    PT Problem List:   PT Treatment Interventions:     PT Goals Acute Rehab PT Goals PT Goal: Supine/Side to Sit - Progress: Progressing toward goal PT Goal: Sit to Supine/Side - Progress: Progressing toward goal PT Goal: Sit to Stand - Progress: Progressing toward goal PT Goal: Stand to Sit - Progress: Progressing toward goal PT Goal: Ambulate - Progress: Progressing toward goal  Visit Information  Last PT Received On: 02/23/12 Assistance Needed: +1    Subjective Data      Cognition  Overall Cognitive Status: Impaired Area of Impairment: Safety/judgement;Following commands;Attention Arousal/Alertness: Awake/alert Orientation Level: Disoriented X4;Situation;Time Behavior During Session: [facility] for tasks performed Current Attention Level: Sustained;Other (comment) (15 sec) Following Commands: Follows one step commands with increased time Safety/Judgement: Impulsive;Decreased safety judgement for tasks assessed;Decreased awareness of need for assistance    Balance     End of Session PT - End of Session Equipment Utilized During Treatment: Gait belt Activity Tolerance: Patient tolerated treatment well Patient left: in bed;with call bell/phone within reach;with bed alarm set;with family/visitor present Nurse Communication: Mobility status    Fredrich Birks 02/23/2012, 10:06 AM 02/23/2012 Fredrich Birks PTA [phone] pager [phone] office

## 2012-02-23 NOTE — Progress Notes (Signed)
Occupational Therapy Treatment Patient Details Name: Albert Pope MRN: [id number] DOB: [birth year] Today's Date: 02/23/2012 Time: 4098-1191 OT Time Calculation (min): 62 min  OT Assessment / Plan / Recommendation Comments on Treatment Session Pt. continues with cognitive impairment, but significantly improved compared to Friday.  Pt. requires min guard assist - min A for basic ADLs.  Family very supportive and able to provide needed assistance at home.    Follow Up Recommendations  Home health OT;Supervision/Assistance - 24 hour    Barriers to Discharge       Equipment Recommendations  3 in 1 bedside comode;Rolling walker with 5" wheels    Recommendations for Other Services    Frequency Min 2X/week   Plan Discharge plan remains appropriate    Precautions / Restrictions Precautions Precautions: Cervical Precaution Comments: Reviewed precautions with patient, son and wife.  Required Braces or Orthoses: Cervical Brace Cervical Brace: Hard collar (for comfort) Restrictions Weight Bearing Restrictions: No       ADL  Grooming: Simulated;Wash/dry hands;Teeth care;Minimal assistance (for walker safety) Where Assessed - Grooming: Unsupported standing Lower Body Dressing: Min guard;Performed (socks) Where Assessed - Lower Body Dressing: Unsupported sit to stand Toilet Transfer: Performed;Min guard Toilet Transfer Method: Sit to Barista: Raised toilet seat with arms (or 3-in-1 over toilet) Toileting - Clothing Manipulation and Hygiene: Performed;Min guard Where Assessed - Glass blower/designer Manipulation and Hygiene: Standing Equipment Used: Rolling walker Transfers/Ambulation Related to ADLs: min guard assist, and min guard for walker safety.  Pt. noted to consistently run into items on Lt. side.   ADL Comments: Wife and son present.  discussed safety issues with them.  They verbalize understanding of need for direct assist/supervision.  Discussed keeping  information simple and to the point and the need/benefit of daily structure to improve cognition - they verbalize understanding.      OT Diagnosis:    OT Problem List:   OT Treatment Interventions:     OT Goals Acute Rehab OT Goals Time For Goal Achievement: 02/27/12 ADL Goals ADL Goal: Lower Body Dressing - Progress: Progressing toward goals ADL Goal: Toilet Transfer - Progress: Progressing toward goals ADL Goal: Toileting - Clothing Manipulation - Progress: Progressing toward goals ADL Goal: Additional Goal #1 - Progress: Progressing toward goals  Visit Information  Last OT Received On: 02/23/12 Assistance Needed: +1    Subjective Data      Prior Functioning       Cognition  Overall Cognitive Status: Impaired Area of Impairment: Attention;Memory;Safety/judgement;Awareness of errors;Awareness of deficits;Problem solving Arousal/Alertness: Awake/alert Orientation Level: Time;Disoriented to (however, able to use calendar with mod cues) Behavior During Session: [facility] for tasks performed Current Attention Level: Selective (min cues) Memory: Decreased recall of precautions Following Commands: Follows one step commands with increased time Safety/Judgement: Impulsive;Decreased safety judgement for tasks assessed;Decreased awareness of safety precautions Awareness of Errors: Assistance required to correct errors made;Assistance required to identify errors made Cognition - Other Comments: Cognition continues to be impaired, but significantly improved from Friday    Mobility Bed Mobility Bed Mobility: Supine to Sit Rolling Right: 4: Min guard Supine to Sit: 4: Min guard;HOB elevated;With rails (30*) Sitting - Scoot to Edge of Bed: 4: Min guard Sit to Supine: 4: Min guard Details for Bed Mobility Assistance: Min Guard for safety Transfers Transfers: Sit to Stand;Stand to Sit Sit to Stand: 4: Min guard;With upper extremity assist;From chair/3-in-1;From bed Stand to Sit: 4: Min  guard;To bed;With armrests;To chair/3-in-1 Details for Transfer Assistance: Patient continues to be  impulsive. Cues for safety and technique. Patient stood a total of 5 times throughout session   Exercises    Balance    End of Session OT - End of Session Equipment Utilized During Treatment: Cervical collar Activity Tolerance: Patient tolerated treatment well Patient left: in bed;with call bell/phone within reach;with family/visitor present;with bed alarm set   Emoni Whitworth, Ursula Alert M 02/23/2012, 11:53 AM

## 2012-02-23 NOTE — Progress Notes (Signed)
Subjective: Patient reports Usual but some of this morning but fe feeling well.  Objective: Vital signs in last 24 hours: Temp:  [97.9 F (36.6 C)-98.5 F (36.9 C)] 98.5 F (36.9 C) (05/27 0645) Pulse Rate:  [67-79] 79  (05/27 0645) Resp:  [20] 20  (05/27 0645) BP: (109-143)/(66-82) 143/82 mmHg (05/27 0645) SpO2:  [94 %-96 %] 96 % (05/27 0645)  Intake/Output from previous day: 05/26 0701 - 05/27 0700 In: -  Out: 750 [Urine:750] Intake/Output this shift:    Somnolent but arousable loose oximeters well  Lab Results: No results found for this basename: WBC:2,HGB:2,HCT:2,PLT:2 in the last 72 hours BMET  [facility] 02/21/12 0845  NA 143  K 4.4  CL 105  CO2 27  GLUCOSE 149*  BUN 30*  CREATININE 1.16  CALCIUM 9.5    Studies/Results: No results found.  Assessment/Plan: Progressive mobilization today DC his Foley possible discharge tomorrow if he is urinating without retention  LOS: 5 days     Jayli Fogleman P 02/23/2012, 7:55 AM

## 2012-02-24 NOTE — Progress Notes (Signed)
Occupational Therapy Treatment Patient Details Name: VANDELL KUN MRN: [id number] DOB: October 11, 1924 Today's Date: 02/24/2012 Time: 7829-5621 OT Time Calculation (min): 11 min  OT Assessment / Plan / Recommendation Comments on Treatment Session Pt appears less impulsive during this therapy session. Great family support available.     Follow Up Recommendations  Home health OT;Supervision/Assistance - 24 hour    Barriers to Discharge       Equipment Recommendations  3 in 1 bedside comode;Rolling walker with 5" wheels    Recommendations for Other Services    Frequency     Plan Discharge plan remains appropriate    Precautions / Restrictions Precautions Precautions: Cervical Precaution Comments: wife able to verbalize precautions   Pertinent Vitals/Pain Pt with no c/o pain     ADL  Tub/Shower Transfer: Simulated;Min guard Tub/Shower Transfer Method: Ambulating Tub/Shower Transfer Equipment: Walk in shower Transfers/Ambulation Related to ADLs: Min HHA for ambulation in room and into/out of shower. Pt with slight posterior LOB on initial simulated shower transfer, but did not exhibit this again for other simulated transfers x 2 ADL Comments: Spoke with pt and wife regarding any concerns for d/c home. After seeing shower transfer, wife states she feels confident she can assist her husband with no issues. Pt appropriate during session, following one-step directions well and safely. No impulsivity noted during session.     OT Diagnosis:    OT Problem List:   OT Treatment Interventions:     OT Goals ADL Goals ADL Goal: Tub/Shower Transfer - Progress: Met ADL Goal: Additional Goal #1 - Progress: Progressing toward goals  Visit Information  Last OT Received On: 02/24/12 Assistance Needed: +1    Subjective Data      Prior Functioning       Cognition  Overall Cognitive Status: Impaired Arousal/Alertness: Awake/alert Behavior During Session: [facility] for tasks performed Memory:  Decreased recall of precautions Following Commands: Follows one step commands with increased time;Follows multi-step commands consistently    Mobility Transfers Sit to Stand: 5: Supervision;With armrests;From chair/3-in-1 Stand to Sit: 5: Supervision;With armrests;To chair/3-in-1   Exercises    Balance    End of Session OT - End of Session Activity Tolerance: Patient tolerated treatment well Patient left: in chair;with call bell/phone within reach;with family/visitor present   Everlynn Sagun 02/24/2012, 11:56 AM

## 2012-02-24 NOTE — Discharge Summary (Signed)
Physician Discharge Summary  Patient ID: Albert Pope MRN: [id number] DOB/AGE: 1925-08-11 78 y.o.  Admit date: 02/18/2012 Discharge date: 02/24/2012  Admission Diagnoses:c56 stenosis .hnp. Parkinson disease  Discharge Diagnoses: same plus urinary retention    Discharged Condition:no pain, no weakness. Mentally more clear}  Hospital Course: cervical fusion 02/18/12  Consults:none  Significant Diagnostic Studies: mri  Treatments: surgery  Discharge Exam: Blood pressure 119/74, pulse 66, temperature 97.2 F (36.2 C), temperature source Oral, resp. rate 18, height 5\' 10"  (1.778 m), weight 76.204 kg (168 lb), SpO2 97.00%. No pain, no weakness. Wound dry.oriented in space and person. Tremor secondary to parkinson  Disposition: home.wife declined transfer to a nursing home. Has an appointment with the urologist next month. To see me in 3 weeks. To continue with all preop medications.   Medication List  As of 02/24/2012  1:03 PM   ASK your doctor about these medications         ALPRAZolam 0.25 MG dissolvable tablet   Commonly known as: NIRAVAM   Take 0.25 mg by mouth at bedtime as needed.      aspirin 81 MG tablet   Take 81 mg by mouth daily.      atenolol 25 MG tablet   Commonly known as: TENORMIN   Take 25 mg by mouth daily.      carbidopa-levodopa 25-100 MG per tablet   Commonly known as: SINEMET IR   Take 1.5 tablets by mouth 4 (four) times daily. Pt takes med @@ 0700, 1200, 1600, 1900      ibuprofen 100 MG chewable tablet   Commonly known as: ADVIL,MOTRIN   Chew 100 mg by mouth every 8 (eight) hours as needed. For pain      levothyroxine 25 MCG tablet   Commonly known as: SYNTHROID, LEVOTHROID   Take 25 mcg by mouth daily.      mirtazapine 30 MG tablet   Commonly known as: REMERON   Take 30 mg by mouth at bedtime.      omeprazole 20 MG capsule   Commonly known as: PRILOSEC   Take 20 mg by mouth daily.             Signed: Karn Cassis 02/24/2012,  1:03 PM

## 2012-02-24 NOTE — Progress Notes (Signed)
Physical Therapy Treatment Patient Details Name: Albert Pope MRN: [id number] DOB: 1925/06/30 Today's Date: 02/24/2012 Time: 7829-5621 PT Time Calculation (min): 18 min  PT Assessment / Plan / Recommendation Comments on Treatment Session  Patient is much clearer today than previous sessions. Patient following commands better and more appropriate with activity    Follow Up Recommendations  Home health PT;Supervision/Assistance - 24 hour    Barriers to Discharge        Equipment Recommendations  3 in 1 bedside comode;Rolling walker with 5" wheels (vs. No RW)    Recommendations for Other Services    Frequency Min 5X/week   Plan Discharge plan remains appropriate    Precautions / Restrictions Precautions Precautions: Cervical Precaution Comments: wife able to verbalize precautions   Pertinent Vitals/Pain Denied pain    Mobility  Bed Mobility Bed Mobility: Not assessed Transfers Sit to Stand: 5: Supervision;From chair/3-in-1;With armrests Stand to Sit: 5: Supervision;To chair/3-in-1;With armrests Details for Transfer Assistance: Patient requring cues for safety but less impulsive today Ambulation/Gait Ambulation Distance (Feet): 300 Feet Assistive device: 1 person hand held assist Ambulation/Gait Assistance Details: Cues for posture. Patient RW hinders his safety awareness and function gait. Encouraged HHA at this time.  Gait Pattern: Step-through pattern;Trunk flexed Stairs: Yes Stairs Assistance: 4: Min assist Stairs Assistance Details (indicate cue type and reason): A for HHA and one rail as patient does not have rails a home. Wife educated on proper stair technique Stair Management Technique: One rail Right;Step to pattern;Forwards Number of Stairs: 3     Exercises     PT Diagnosis:    PT Problem List:   PT Treatment Interventions:     PT Goals Acute Rehab PT Goals PT Goal: Sit to Stand - Progress: Met PT Goal: Stand to Sit - Progress: Met PT Goal: Ambulate -  Progress: Progressing toward goal PT Goal: Up/Down Stairs - Progress: Progressing toward goal  Visit Information  Last PT Received On: 02/24/12 Assistance Needed: +1    Subjective Data  Subjective: I was confused earlier   Cognition  Overall Cognitive Status: Impaired Area of Impairment: Attention;Following commands Arousal/Alertness: Awake/alert Orientation Level: Oriented X4 / Intact Behavior During Session: [facility] for tasks performed Current Attention Level: Selective Memory: Decreased recall of precautions Following Commands: Follows one step commands consistently Awareness of Errors: Assistance required to identify errors made    Balance     End of Session PT - End of Session Equipment Utilized During Treatment: Gait belt Activity Tolerance: Patient tolerated treatment well Patient left: in chair;Other (comment) (with OT) Nurse Communication: Mobility status    Lenette Rau, Adline Potter 02/24/2012, 12:20 PM 02/24/2012 Fredrich Birks PTA [phone] pager [phone] office

## 2012-02-24 NOTE — Progress Notes (Signed)
Patient D/C instructions and education given to patient and family. All questions answered to patient and family's satisfaction. Pt D/C home with no signs of acute distress.

## 2012-05-11 ENCOUNTER — Other Ambulatory Visit: Payer: Self-pay | Admitting: Dermatology

## 2012-12-27 ENCOUNTER — Ambulatory Visit (HOSPITAL_COMMUNITY)
Admission: RE | Admit: 2012-12-27 | Discharge: 2012-12-27 | Disposition: A | Discharge status: Home / Self Care | Payer: Medicare Other | Source: Ambulatory Visit | Attending: Internal Medicine | Admitting: Internal Medicine

## 2012-12-27 ENCOUNTER — Other Ambulatory Visit (HOSPITAL_COMMUNITY): Payer: Self-pay | Admitting: Internal Medicine

## 2012-12-27 DIAGNOSIS — M4000 Postural kyphosis, site unspecified: Secondary | ICD-10-CM | POA: Insufficient documentation

## 2012-12-27 DIAGNOSIS — M542 Cervicalgia: Secondary | ICD-10-CM

## 2013-02-13 ENCOUNTER — Emergency Department (HOSPITAL_COMMUNITY): Payer: Medicare Other

## 2013-02-13 ENCOUNTER — Emergency Department (HOSPITAL_COMMUNITY)
Admission: EM | Admit: 2013-02-13 | Discharge: 2013-02-13 | Disposition: A | Discharge status: Home / Self Care | Payer: Medicare Other | Attending: Emergency Medicine | Admitting: Emergency Medicine

## 2013-02-13 ENCOUNTER — Encounter (HOSPITAL_COMMUNITY): Payer: Self-pay | Admitting: Emergency Medicine

## 2013-02-13 DIAGNOSIS — Z79899 Other long term (current) drug therapy: Secondary | ICD-10-CM | POA: Insufficient documentation

## 2013-02-13 DIAGNOSIS — G250 Essential tremor: Secondary | ICD-10-CM | POA: Insufficient documentation

## 2013-02-13 DIAGNOSIS — I10 Essential (primary) hypertension: Secondary | ICD-10-CM | POA: Insufficient documentation

## 2013-02-13 DIAGNOSIS — R296 Repeated falls: Secondary | ICD-10-CM | POA: Insufficient documentation

## 2013-02-13 DIAGNOSIS — K219 Gastro-esophageal reflux disease without esophagitis: Secondary | ICD-10-CM | POA: Insufficient documentation

## 2013-02-13 DIAGNOSIS — G20 Parkinson's disease: Secondary | ICD-10-CM | POA: Insufficient documentation

## 2013-02-13 DIAGNOSIS — E039 Hypothyroidism, unspecified: Secondary | ICD-10-CM | POA: Insufficient documentation

## 2013-02-13 DIAGNOSIS — Y92009 Unspecified place in unspecified non-institutional (private) residence as the place of occurrence of the external cause: Secondary | ICD-10-CM | POA: Insufficient documentation

## 2013-02-13 DIAGNOSIS — F411 Generalized anxiety disorder: Secondary | ICD-10-CM | POA: Insufficient documentation

## 2013-02-13 DIAGNOSIS — Y93E8 Activity, other personal hygiene: Secondary | ICD-10-CM | POA: Insufficient documentation

## 2013-02-13 DIAGNOSIS — S1093XA Contusion of unspecified part of neck, initial encounter: Secondary | ICD-10-CM | POA: Insufficient documentation

## 2013-02-13 DIAGNOSIS — S12000A Unspecified displaced fracture of first cervical vertebra, initial encounter for closed fracture: Secondary | ICD-10-CM | POA: Insufficient documentation

## 2013-02-13 DIAGNOSIS — W19XXXA Unspecified fall, initial encounter: Secondary | ICD-10-CM

## 2013-02-13 DIAGNOSIS — G252 Other specified forms of tremor: Secondary | ICD-10-CM | POA: Insufficient documentation

## 2013-02-13 DIAGNOSIS — Z8546 Personal history of malignant neoplasm of prostate: Secondary | ICD-10-CM | POA: Insufficient documentation

## 2013-02-13 DIAGNOSIS — G20A1 Parkinson's disease without dyskinesia, without mention of fluctuations: Secondary | ICD-10-CM | POA: Insufficient documentation

## 2013-02-13 DIAGNOSIS — S0003XA Contusion of scalp, initial encounter: Secondary | ICD-10-CM | POA: Insufficient documentation

## 2013-02-13 DIAGNOSIS — Z87891 Personal history of nicotine dependence: Secondary | ICD-10-CM | POA: Insufficient documentation

## 2013-02-13 LAB — POCT I-STAT, CHEM 8
Creatinine, Ser: 1.2 mg/dL (ref 0.50–1.35)
Glucose, Bld: 92 mg/dL (ref 70–99)
Hemoglobin: 16 g/dL (ref 13.0–17.0)
Potassium: 3.9 mEq/L (ref 3.5–5.1)

## 2013-02-13 MED ORDER — ACETAMINOPHEN 325 MG PO TABS
650.0000 mg | ORAL_TABLET | Freq: Once | ORAL | Status: AC
  Administered 2013-02-13: 650 mg via ORAL
  Filled 2013-02-13: qty 2

## 2013-02-13 MED ORDER — ACETAMINOPHEN-CODEINE #3 300-30 MG PO TABS: 1.0000 | ORAL_TABLET | Freq: Four times a day (QID) | ORAL | Status: DC | PRN

## 2013-02-13 NOTE — ED Notes (Signed)
Patient transported to X-ray 

## 2013-02-13 NOTE — ED Notes (Signed)
ZOX:WRUE<AV> Expected date:<BR> Expected time:<BR> Means of arrival:<BR> Comments:<BR> Hold ems- fall hematoma to back of head, not on blood thinners

## 2013-02-13 NOTE — ED Notes (Signed)
Per EMS: pt been on Flexeril since Friday, new medication. Unwitnessed fall in bedroom, hematoma back left of head.

## 2013-02-13 NOTE — ED Provider Notes (Signed)
History     CSN: [id number]  Arrival date & time 02/13/13  1806   First MD Initiated Contact with Patient 02/13/13 1830      Chief Complaint  Patient presents with  . Fall    (Consider location/radiation/quality/duration/timing/severity/associated sxs/prior treatment) HPI Comments: Patient presents from home after unwitnessed fall. His wife states he was trying to put his pants when he stumbled and fell backwards hitting the back of his head. No loss of consciousness. No use of blood thinners. Recently started on Flexeril which may have contributed to his fall today. He has a history of Parkinson's disease, previous cervical spine fracture hyper tension and hypothyroidism. Patient denies any chest pain, shortness of breath, abdominal pain nausea or vomiting. He is at his mental baseline per family.  The history is provided by the patient and the EMS personnel. The history is limited by the condition of the patient.    Past Medical History  Diagnosis Date  . Parkinson disease   . GERD (gastroesophageal reflux disease)   . Hypertension   . Hypothyroidism     takes meds,  . Cancer     prostate  . Neuromuscular disorder     PARKINSONS  . Anxiety     Past Surgical History  Procedure Laterality Date  . Prostate surgery    . Appendectomy    . Cervical discectomy  02/18/2012     C 5 C6    . Anterior cervical decomp/discectomy fusion  02/18/2012    Procedure: ANTERIOR CERVICAL DECOMPRESSION/DISCECTOMY FUSION 1 LEVEL;  Surgeon: Karn Cassis, MD;  Location: [facility];  Service: Neurosurgery;  Laterality: N/A;  Cervical five-six Anterior cervical decompression/diskectomy, fusion, plate    Family History  Problem Relation Age of Onset  . Anesthesia problems Neg Hx   . Hypotension Neg Hx   . Malignant hyperthermia Neg Hx   . Pseudochol deficiency Neg Hx     History  Substance Use Topics  . Smoking status: Former Smoker -- 1.00 packs/day for 25 years  . Smokeless tobacco:  Current User    Types: Chew  . Alcohol Use: No      Review of Systems  Unable to perform ROS: Dementia    Allergies  Review of patient's allergies indicates no known allergies.  Home Medications   Current Outpatient Rx  Name  Route  Sig  Dispense  Refill  . ALPRAZolam (NIRAVAM) 0.25 MG dissolvable tablet   Oral   Take 0.25 mg by mouth at bedtime as needed.         Marland Kitchen aspirin 81 MG tablet   Oral   Take 81 mg by mouth daily.          Marland Kitchen atenolol (TENORMIN) 25 MG tablet   Oral   Take 12.5 mg by mouth daily after lunch.          . carbidopa-levodopa (SINEMET IR) 25-100 MG per tablet   Oral   Take 1.5 tablets by mouth 4 (four) times daily. Pt takes med @@ 0700, 1200, 1600, 1900         . cyclobenzaprine (FLEXERIL) 10 MG tablet   Oral   Take 10 mg by mouth 3 (three) times daily as needed for muscle spasms.         Marland Kitchen ibuprofen (ADVIL,MOTRIN) 200 MG tablet   Oral   Take 200 mg by mouth 3 (three) times daily.         Marland Kitchen levothyroxine (SYNTHROID, LEVOTHROID) 25 MCG tablet  Oral   Take 25 mcg by mouth daily.         . Melatonin 10 MG CAPS   Oral   Take 10 mg by mouth at bedtime.         Marland Kitchen omeprazole (PRILOSEC) 20 MG capsule   Oral   Take 20 mg by mouth daily.         . QUEtiapine (SEROQUEL) 50 MG tablet   Oral   Take 50 mg by mouth at bedtime.         . tamsulosin (FLOMAX) 0.4 MG CAPS   Oral   Take 0.4 mg by mouth daily after supper.         . venlafaxine (EFFEXOR) 75 MG tablet   Oral   Take 75 mg by mouth 2 (two) times daily.         . vitamin B-12 (CYANOCOBALAMIN) 1000 MCG tablet   Oral   Take 1,000 mcg by mouth daily with supper.           BP 167/91  Pulse 58  Resp 16  SpO2 99%  Physical Exam  Constitutional: He is oriented to person, place, and time. He appears well-developed and well-nourished. No distress.  HENT:  Head: Normocephalic and atraumatic.  Mouth/Throat: Oropharynx is clear and moist. No oropharyngeal  exudate.  Hematoma to left occiput  Eyes: Conjunctivae and EOM are normal. Pupils are equal, round, and reactive to light.  Neck: Normal range of motion. Neck supple.  Diffuse tenderness to palpation of the C spine without step off or deformity.  Cardiovascular: Normal rate, regular rhythm and normal heart sounds.   No murmur heard. Pulmonary/Chest: Effort normal and breath sounds normal. No respiratory distress.  Abdominal: Soft. There is no tenderness. There is no rebound and no guarding.  Musculoskeletal: Normal range of motion. He exhibits no edema and no tenderness.  Neurological: He is alert and oriented to person, place, and time. No cranial nerve deficit. He exhibits normal muscle tone. Coordination normal.  Equal grip strength bilaterally. 5 out of 5 strength throughout, no cranial nerve deficits Resting tremor her right upper extremity  Skin: Skin is warm.    ED Course  Procedures (including critical care time)  Labs Reviewed  POCT I-STAT, CHEM 8 - Abnormal; Notable for the following:    BUN 28 (*)    All other components within normal limits  POCT I-STAT TROPONIN I   Dg Chest 2 View  02/13/2013   *RADIOLOGY REPORT*  Clinical Data: Trauma secondary to a fall.  CHEST - 2 VIEW  Comparison: 10/11/2011  Findings: The heart size and pulmonary vascularity are normal and the lungs are clear of acute infiltrates and effusions.  Calcified granuloma at the left lung base.  No acute osseous abnormality.  IMPRESSION: No acute disease in the chest.   Original Report Authenticated By: Francene Boyers, M.D.   Ct Head Wo Contrast  02/13/2013   *RADIOLOGY REPORT*  Clinical Data: The patient fell and has head trauma with a scalp hematoma posteriorly.  CT HEAD WITHOUT CONTRAST  Technique:  Contiguous axial images were obtained from the base of the skull through the vertex without contrast.  Comparison: CT scan dated 02/08/2012  Findings: There is no acute intracranial hemorrhage, infarction, or  mass lesion.  There is diffuse slight cerebral cortical atrophy. No acute osseous abnormality.  Small posterior parietal scalp hematoma.  IMPRESSION: Posterior parietal scalp hematoma.  No other acute abnormality.   Original Report Authenticated By: Francene Boyers,  M.D.   Ct Cervical Spine Wo Contrast  02/13/2013   *RADIOLOGY REPORT*  Clinical Data: Head trauma secondary to a fall.  CT CERVICAL SPINE WITHOUT CONTRAST  Technique:  Multidetector CT imaging of the cervical spine was performed. Multiplanar CT image reconstructions were also generated.  Comparison: CT scan dated 02/08/2012  Findings: There are acute fractures of the right side of the anterior and posterior arches of C1. The right lateral mass is disassociated from the remainder of C1 and is slightly subluxed laterally.  There is no other acute abnormality of the cervical spine.  The patient does have a reversal of the cervical lordosis at the site of a previous cervical spine fracture at C5-6 which has been treated with anterior fusion.  The fusion appears to be solid. There is also solid fusion of C3-4.  There is multilevel degenerative disc and joint disease in the cervical spine.  There is no impingement upon the spinal cord at the site of the acute fracture.  IMPRESSION:  1.  Acute fracture of the right anterior and posterior arches of C1 with slight lateral subluxation of the right lateral mass. 2.  No other acute abnormality.  Critical Value/emergent results were called by telephone at the time of interpretation on 02/13/2013  at 7:30 p.m. to        Dr. Manus Gunning, who verbally acknowledged these results.   Original Report Authenticated By: Francene Boyers, M.D.     No diagnosis found.    MDM  Mechanical fall while trying to put on pants. Hitting back of head, no loss of consciousness. History of C-spine fracture one year ago.  C1 fracture discussed with Dr. Dutch Quint neurosurgery. Patient is neurologically intact. Fracture is stable. Will keep  in cervical collar, follow up with Dr. Jeral Fruit next week.  Family worried About having enough help at home. Discussed with on-call case manager who spoke with patient's family members. Will arrange for [facility] to visit tomorrow.  Advised patient to discontinue Flexeril use. We'll give Tylenol with Codeine for pain. Followup with Dr. Jeral Fruit this week. Keep collar on at all times.   Date: 02/13/2013  Rate: 58  Rhythm: normal sinus rhythm  QRS Axis: left  Intervals: normal  ST/T Wave abnormalities: nonspecific ST/T changes  Conduction Disutrbances:right bundle branch block  Narrative Interpretation:   Old EKG Reviewed: unchanged    Glynn Octave, MD 02/13/13 2059

## 2013-02-14 NOTE — Progress Notes (Signed)
[facility] ED CM consulted by on call CM, Cheryl.  ED Cm spoke with Baxter Hire, [facility] coordinator to complete home health referral for home health RN, and aide.

## 2013-02-14 NOTE — Care Management Note (Signed)
   CARE MANAGEMENT NOTE 02/14/2013  Patient:  Albert Pope, Albert Pope   Account Number:  [id number]  Date Initiated:  02/14/2013  Documentation initiated by:  Johny Shock  Subjective/Objective Assessment:   As on-call CM received call from [facility] ED, spoke with Dr Manus Gunning re this pt and need for [facility] services @ 2032 on Sunday, 02/13/2013. We agreed to [facility] visit and [facility] aide for assistance.     Action/Plan:   Spoke with MD and with pt daughter, and agreed to arrange a [facility] visit for on 02/14/2013 am, and this CM would notify family of HH agency, dependent upon pt insurance.   Anticipated DC Date:  02/13/2013   Anticipated DC Plan:  HOME W HOME HEALTH SERVICES         Choice offered to / List presented to:          [facility] arranged  HH-1 RN  HH-4 NURSE'S AIDE      HH agency  [facility]   Status of service:   Medicare Important Message given?   (If response is "NO", the following Medicare IM given date fields will be blank) Date Medicare IM given:   Date Additional Medicare IM given:    Discharge Disposition:  HOME W HOME HEALTH SERVICES  Per UR Regulation:    If discussed at Long Length of Stay Meetings, dates discussed:    Comments:  02/14/2013 Attempted to setup [facility] services , however upon pulling up chart noted that Dr Manus Gunning had not entered orders for [facility] services. His order was for CM consult only. Spoke with ED CM, Marval Regal at [facility] who checked Dr Manus Gunning schedule and noted that he would not return until Friday 02/18/2013. This CM then entered order for [facility] aide as a verbal order, however unable to complete face to face. Shauna Hugh will ask [facility] representative at [facility] to provide these services and will leave a message for Dr Manus Gunning to enter the face to face on Friday 5/23 , upon his return. Call placed to [facility] residence and spoke with pt daughter, informed  her that [facility] would provide these services and would be in contact with family to arrange visits. Pt daughter stated  that she had contacted [facility] and [employer] and both will eval this pt and assist this family if needed. Johny Shock RN MPH Case manager [phone]

## 2013-03-07 ENCOUNTER — Other Ambulatory Visit: Payer: Self-pay | Admitting: Neurosurgery

## 2013-03-07 DIAGNOSIS — S129XXA Fracture of neck, unspecified, initial encounter: Secondary | ICD-10-CM

## 2013-03-14 ENCOUNTER — Other Ambulatory Visit: Payer: Medicare Other

## 2013-03-14 ENCOUNTER — Ambulatory Visit
Admission: RE | Admit: 2013-03-14 | Discharge: 2013-03-14 | Disposition: A | Discharge status: Home / Self Care | Payer: Medicare Other | Source: Ambulatory Visit | Attending: Neurosurgery | Admitting: Neurosurgery

## 2013-03-14 DIAGNOSIS — S129XXA Fracture of neck, unspecified, initial encounter: Secondary | ICD-10-CM

## 2013-03-24 ENCOUNTER — Other Ambulatory Visit: Payer: Self-pay | Admitting: Neurosurgery

## 2013-03-24 DIAGNOSIS — S129XXA Fracture of neck, unspecified, initial encounter: Secondary | ICD-10-CM

## 2013-04-08 ENCOUNTER — Encounter: Payer: Self-pay | Admitting: Neurology

## 2013-04-08 ENCOUNTER — Ambulatory Visit (INDEPENDENT_AMBULATORY_CARE_PROVIDER_SITE_OTHER): Payer: Medicare Other | Admitting: Neurology

## 2013-04-08 VITALS — BP 103/60 | HR 62 | Temp 97.5°F | Ht 68.0 in | Wt 171.0 lb

## 2013-04-08 DIAGNOSIS — G20 Parkinson's disease: Secondary | ICD-10-CM

## 2013-04-08 NOTE — Patient Instructions (Addendum)
Use your walker at all times, you are at severe fall risk.   Do not get out of bed by yourself!  I would like to see you back in 3 months, sooner if we need to. Please call us with any interim questions, concerns, problems, updates or refill requests.  Brett Canales is my clinical assistant and will answer any of your questions and relay your messages to me and also relay most of my messages to you.  Our phone number is [phone]. We also have an after hours call service for urgent matters and there is a physician on-call for urgent questions. For any emergencies you know to call 911 or go to the nearest emergency room.   Call for prescription on the Levodopa and Seroquel.

## 2013-04-08 NOTE — Progress Notes (Signed)
Subjective:    Patient ID: Albert Pope is a 79 y.o. male.  HPI  Interim history:   [name] is a very pleasant 79 year old right-handed gentleman who presents for followup consultation of his Parkinson's disease, complicated by memory loss, hallucinations and dyskinesias and recurrent. He is accompanied by his wife today. This is his first visit with me and he previously followed by Dr. Fayrene Fearing love and was last seen by him on 12/07/2012, at which time Dr. Sandria Manly felt that he had significant underlying depression and was having delusions secondary to depression or dementia. He was referred to psychiatry at the time. He had significant sialorrhea for which he was going to get Botox injections under Dr. Lorin Picket at [facility]. He has an underlying medical history of prostate cancer, depression, dementia, hypothyroidism, arthritis. He is currently on Seroquel 12.5 mg at night, Effexor XR 75 mg daily, Flomax, Xanax 0.25 mg at bedtime, Sinemet 25-100 mg strength one and half tablets at 8 AM and 1 at 11 AM, 1 at 4 PM and 1 at 7 PM, B12 injections once monthly, atenolol 12.5 mg daily, omeprazole, levothyroxine, ibuprofen as needed and baby aspirin daily.   I reviewed Dr. Imagene Gurney prior notes and the patient's records and below is a summary of that review:  62 year old right-handed gentleman with an 11 year history of idiopathic Parkinson's disease, right-sided predominant, complicated with dyskinesias and hallucinations and memory loss. He had significant nausea on Requip. He's had some delusions regarding losing his house. He developed vertigo in May 2011. He had a CT head at the time which was normal. MRI brain without contrast in May 2011 showed changes of chronic microvascular ischemia and generalized atrophy, MRA head showed no significant large vessel stenosis. He echocardiogram and stress test for workup of shortness of breath which were normal. He fell in May 2013 and hit his head. CT head showed chronic  atrophy and small vessel changes. C-spine x-ray showed degenerative changes without fracture. MRI C-spine showed C5-6 herniated disc and the patient was admitted for surgery in 5/13. In March 2014 his MMSE was 19, clock drawing was two out of four, animal fluency was 4. He has since then fallen several times and fell about 6 weeks ago and fractured C1 and has seen Dr. Jeral Fruit, who has issues a hard collar, which he has to wear 24/7 for at least 8 weeks. He fell this morning while getting ready. He fell on carpet onto the R leg. He does not use his walker at home as the walker is rather large.  He saw Dr. Donell Beers.  He mostly falls when getting out of bed. He has multiple canes and 2 walkers. They have 2 aids, one comes 6h/week and the other comes 7h/week. His last C spine from 03/14/13 CT showed: Unchanged right C1 anterior and posterior arch fractures. No interval healing. Unchanged mild lateral displacement C1 lateral mass. He has an appt with Dr. Jeral Fruit on 05/09/13.  His last CTH was in May showing: Posterior parietal scalp hematoma. No other acute abnormality.  His Past Medical History Is Significant For: Past Medical History  Diagnosis Date  . Parkinson disease   . GERD (gastroesophageal reflux disease)   . Hypertension   . Hypothyroidism     takes meds,  . Cancer     prostate  . Neuromuscular disorder     PARKINSONS  . Anxiety     His Past Surgical History Is Significant For: Past Surgical History  Procedure Laterality Date  .  Prostate surgery    . Appendectomy    . Cervical discectomy  02/18/2012     C 5 C6    . Anterior cervical decomp/discectomy fusion  02/18/2012    Procedure: ANTERIOR CERVICAL DECOMPRESSION/DISCECTOMY FUSION 1 LEVEL;  Surgeon: Karn Cassis, MD;  Location: [facility];  Service: Neurosurgery;  Laterality: N/A;  Cervical five-six Anterior cervical decompression/diskectomy, fusion, plate    His Family History Is Significant For: Family History  Problem  Relation Age of Onset  . Anesthesia problems Neg Hx   . Hypotension Neg Hx   . Malignant hyperthermia Neg Hx   . Pseudochol deficiency Neg Hx     His Social History Is Significant For: History   Social History  . Marital Status: Married    Spouse Name: Kathie Rhodes    Number of Children: 1  . Years of Education: HS   Social History Main Topics  . Smoking status: Former Smoker -- 1.00 packs/day for 25 years  . Smokeless tobacco: Current User    Types: Chew  . Alcohol Use: No  . Drug Use: No  . Sexually Active: Not Currently   Other Topics Concern  . None   Social History Narrative   Pt lives at home with spouse.   Caffeine Use: none    His Allergies Are:  No Known Allergies:   His Current Medications Are:  Outpatient Encounter Prescriptions as of 04/08/2013  Medication Sig Dispense Refill  . acetaminophen (TYLENOL) 500 MG tablet Take 500 mg by mouth every 6 (six) hours as needed for pain.      Marland Kitchen ALPRAZolam (NIRAVAM) 0.25 MG dissolvable tablet Take 0.25 mg by mouth at bedtime as needed.      Marland Kitchen aspirin 81 MG tablet Take 81 mg by mouth daily.       Marland Kitchen atenolol (TENORMIN) 25 MG tablet Take 12.5 mg by mouth daily after lunch.       . carbidopa-levodopa (SINEMET IR) 25-100 MG per tablet Take 1.5 tablets by mouth 4 (four) times daily. Pt takes med @@ 0700, 1200, 1600, 1900      . cyclobenzaprine (FLEXERIL) 10 MG tablet Take 10 mg by mouth 3 (three) times daily as needed for muscle spasms.      Marland Kitchen ibuprofen (ADVIL,MOTRIN) 200 MG tablet Take 200 mg by mouth 3 (three) times daily.      Marland Kitchen levothyroxine (SYNTHROID, LEVOTHROID) 25 MCG tablet Take 25 mcg by mouth daily.      . Melatonin 10 MG CAPS Take 10 mg by mouth at bedtime.      Marland Kitchen omeprazole (PRILOSEC) 20 MG capsule Take 20 mg by mouth daily.      . QUEtiapine (SEROQUEL) 50 MG tablet Take 50 mg by mouth at bedtime.      . tamsulosin (FLOMAX) 0.4 MG CAPS Take 0.4 mg by mouth daily after supper.      . venlafaxine (EFFEXOR) 75 MG tablet  Take 75 mg by mouth 2 (two) times daily.      . vitamin B-12 ([employer] VITAMIN B12) 250 MCG tablet Take 250 mcg by mouth daily.      . [DISCONTINUED] acetaminophen-codeine (TYLENOL #3) 300-30 MG per tablet Take 1-2 tablets by mouth every 6 (six) hours as needed for pain.  15 tablet  0  . [DISCONTINUED] vitamin B-12 (CYANOCOBALAMIN) 1000 MCG tablet Take 1,000 mcg by mouth daily with supper.       No facility-administered encounter medications on file as of 04/08/2013.  : Review of  Systems  Constitutional: Positive for fatigue.  HENT: Positive for hearing loss and trouble swallowing.   Eyes: Positive for visual disturbance (double vision).  Gastrointestinal: Positive for diarrhea.       Incontinence  Endocrine: Positive for cold intolerance (feeling cold).  Neurological: Positive for dizziness, tremors and weakness.       Memory loss, confusion, difficulty swallowing  Psychiatric/Behavioral: Positive for sleep disturbance (Restless legs).       Anxiety and Depression, hallucinations, disinterest in activites, decreased energy     Objective:  Neurologic Exam  Physical Exam Physical Examination:   Filed Vitals:   04/08/13 1201  BP: 103/60  Pulse: 62  Temp: 97.5 F (36.4 C)    General Examination: The patient is a very pleasant 79 y.o. male in no acute distress.  HEENT: Normocephalic, atraumatic, pupils are equal, round and reactive to light and accommodation. Funduscopic exam is normal with sharp disc margins noted. Extraocular tracking shows mild saccadic breakdown without nystagmus noted. There is limitation to upper gaze. There is mild decrease in eye blink rate. Hearing is impaired. Tympanic membranes are clear bilaterally. Face is symmetric with mild facial masking and normal facial sensation. There is no lip, neck or jaw tremor. Neck is not moved passively by me today and he is wearing a rigid collar. There are no carotid bruits on auscultation. Oropharynx exam reveals mild mouth  dryness. No significant airway crowding is noted. Mallampati is class II. Tongue protrudes centrally and palate elevates symmetrically.   There is no drooling.   Chest: is clear to auscultation without wheezing, rhonchi or crackles noted.  Heart: sounds are regular and normal without murmurs, rubs or gallops noted.   Abdomen: is soft, non-tender and non-distended with normal bowel sounds appreciated on auscultation.  Extremities: There is no pitting edema in the distal lower extremities bilaterally. There are no varicose veins.  Skin: is warm and dry with no trophic changes noted. Age-related changes are noted on the skin.   Musculoskeletal: exam reveals no obvious joint deformities, tenderness, joint swelling or erythema.  Neurologically:  Mental status: The patient is awake and alert, paying fair  attention. He is unable to completely provide the history. His wife provides details. He is oriented to: person, place, situation, day of week, month of year and year. His memory, attention, language and knowledge are impaired. There is no aphasia, agnosia, apraxia or anomia. There is a mild degree of bradyphrenia. Speech is moderately hypophonic with mild dysarthria noted. Mood is congruent and affect is normal.    Cranial nerves are as described above under HEENT exam. In addition, shoulder shrug is normal with equal shoulder height noted.  Motor exam: Normal bulk, and strength for age is noted. There are no dyskinesias noted.  Tone is mildly rigid with absence of cogwheeling. There is overall mild bradykinesia. There is no drift or rebound.  There is an intermittent RUE resting tremor. Reflexes are 1+ in the upper extremities and trace in the lower extremities. Toes are downgoing bilaterally.  Fine motor skills exam: Finger taps are moderately impaired on the right and moderately impaired on the left. Hand movements are mildly impaired on the right and moderately impaired on the left. RAP  (rapid alternating patting) is mildly impaired on the right and mildly impaired on the left. Foot taps are moderately impaired on the right and mildly impaired on the left. Foot agility (in the form of heel stomping) is severely impaired on the right and moderately impaired on the  left.    Cerebellar testing shows no dysmetria or intention tremor on finger to nose testing. Heel to shin is unremarkable bilaterally. There is no truncal or gait ataxia.   Sensory exam is intact to light touch, pinprick, vibration, temperature sense and proprioception in the upper and lower extremities.   Gait, station and balance: He stands up from the seated position with mild difficulty and does need to push up with His hands. He needs little assistance. No veering to one side is noted. He is not noted to lean to the side. Posture is moderately stooped. Stance is wide-based. He walks with decrease in stride length and pace and decreased arm swing. He turns in 3 steps. Tandem walk is not possible. Balance is moderately impaired. He is not able to do a toe or heel stance.       Assessment and Plan:   Assessment and Plan:  In summary, BRALIN GARRY is a very pleasant 79 y.o.-year old male with a history of advanced Parkinson's disease, right-sided predominant. His history is complicated by dyskinesias, hallucinations, poor medication tolerance, gait disorder and significant balance problems. He has had a significant history of recurrent falls. He has had recent falls with neck injury. I explained to him that he is at significant risk for falling and needs to use his walker at all times. He is advised not to get out of bed by himself at any point in time anymore. I did not make any changes to his medications and would like to see him back in 3 months, sooner if the need arises. He did not require any refills but he gets his medications through the [place]. Once he needs a prescription refill we can fax her prescription or send  him a hard copy for the Sinemet and the Seroquel. He has an appointment to see his neurosurgeon as well as his psychiatrist coming up soon. He and his wife were advised about fall risk and the need to use the walker even in the house and that he should not get out of bed by himself. They understood and were in agreement.

## 2013-05-02 ENCOUNTER — Other Ambulatory Visit: Payer: Medicare Other

## 2013-05-02 ENCOUNTER — Ambulatory Visit
Admission: RE | Admit: 2013-05-02 | Discharge: 2013-05-02 | Disposition: A | Discharge status: Home / Self Care | Payer: Medicare Other | Source: Ambulatory Visit | Attending: Neurosurgery | Admitting: Neurosurgery

## 2013-05-02 ENCOUNTER — Other Ambulatory Visit: Payer: Self-pay | Admitting: Neurosurgery

## 2013-05-02 DIAGNOSIS — S129XXA Fracture of neck, unspecified, initial encounter: Secondary | ICD-10-CM

## 2013-05-09 ENCOUNTER — Other Ambulatory Visit: Payer: Self-pay | Admitting: Neurosurgery

## 2013-05-09 ENCOUNTER — Ambulatory Visit
Admission: RE | Admit: 2013-05-09 | Discharge: 2013-05-09 | Disposition: A | Discharge status: Home / Self Care | Payer: Medicare Other | Source: Ambulatory Visit | Attending: Neurosurgery | Admitting: Neurosurgery

## 2013-05-09 DIAGNOSIS — S129XXA Fracture of neck, unspecified, initial encounter: Secondary | ICD-10-CM

## 2013-05-10 ENCOUNTER — Telehealth: Payer: Self-pay | Admitting: Neurology

## 2013-05-12 NOTE — Telephone Encounter (Signed)
Spoke to spouse. Says she would just like to let Dr. Frances Furbish know the patient is still hallucinating. We went over last OV plan and verified upcoming appt w/ psychiatrist and f/u w/ Dr. Frances Furbish  in October. Spouse agreed, just wanted MD to know.

## 2013-06-17 ENCOUNTER — Ambulatory Visit (INDEPENDENT_AMBULATORY_CARE_PROVIDER_SITE_OTHER): Payer: Medicare Other | Admitting: Cardiovascular Disease

## 2013-06-17 ENCOUNTER — Encounter: Payer: Self-pay | Admitting: Cardiovascular Disease

## 2013-06-17 VITALS — BP 136/60 | HR 79 | Resp 16 | Ht 68.0 in | Wt 172.9 lb

## 2013-06-17 DIAGNOSIS — I4510 Unspecified right bundle-branch block: Secondary | ICD-10-CM

## 2013-06-17 DIAGNOSIS — I10 Essential (primary) hypertension: Secondary | ICD-10-CM

## 2013-06-17 DIAGNOSIS — G20 Parkinson's disease: Secondary | ICD-10-CM

## 2013-06-17 DIAGNOSIS — Z8546 Personal history of malignant neoplasm of prostate: Secondary | ICD-10-CM

## 2013-06-17 DIAGNOSIS — I951 Orthostatic hypotension: Secondary | ICD-10-CM

## 2013-06-17 NOTE — Patient Instructions (Addendum)
Your physician recommends that you schedule a follow-up appointment in: 12 months. Your heart exam sounds unchanged from last year. You have a murmur resulting from a narrow heart valve, that has been changing at a very very slow pace. I don't think it is any different than it was a year ago.  The dizziness that you experience is probably explained in part by orthostatic hypotension (a sudden drop in blood pressure when you stand up from a sitting or laying position). This can sometimes be quite dangerous and in your case has probably contributed to your falls. Many medications can contribute to orthostatic hypotension including the Flomax and Sinemet, which unfortunately are necessary for your other medical problems. It is very important that you avoid standing up and walking away before you have given her body time to readjust. This may take as long as a minute. It is also very important that you always stay very well hydrated. It is also recommended that you liberalize your salt intake. Thigh-high compression stockings might be beneficial. You're having "more skipped beats" since stopping the atenolol, but I agree with discontinuing this medication, since the orthostatic dizziness would be even worse if you continue the atenolol.

## 2013-06-18 DIAGNOSIS — Z8546 Personal history of malignant neoplasm of prostate: Secondary | ICD-10-CM | POA: Insufficient documentation

## 2013-06-18 DIAGNOSIS — I951 Orthostatic hypotension: Secondary | ICD-10-CM | POA: Insufficient documentation

## 2013-06-18 DIAGNOSIS — I4510 Unspecified right bundle-branch block: Secondary | ICD-10-CM | POA: Insufficient documentation

## 2013-06-18 DIAGNOSIS — I10 Essential (primary) hypertension: Secondary | ICD-10-CM | POA: Insufficient documentation

## 2013-06-18 DIAGNOSIS — G20 Parkinson's disease: Secondary | ICD-10-CM | POA: Insufficient documentation

## 2013-06-18 NOTE — Assessment & Plan Note (Addendum)
His movement disorder clearly is contributing to his falls, but I believe that orthostatic hypotension is also playing an important role. He spent a long time discussing the need to stand at the bedside without moving for a while until he starts walking. I explored the options of discontinuing medications that might worsen his orthostasis but clearly he needs both the Sinemet and the Flomax. Fracture or can also cause orthostatic hypotension but to Dr.Athar's note and Dr. Imagene Gurney previous evaluation suggest that they believe his delusions are actually secondary to depression and therefore this medication might be very important for him. We'll have to try a more conservative ways of treating the problem. I've recommended a relatively salt rich diet and aggressive hydration. He would be beneficial that he wear thigh-high support stockings but both the patient and his wife expressed some dismay at how he would have to put these on.

## 2013-06-18 NOTE — Progress Notes (Signed)
Patient ID: Albert Pope, male   DOB: [birth year], 79 y.o.   MRN: [id number]     Reason for office visit Orthostatic hypotension, aortic stenosis, right bundle branch block  Albert Pope is now 79 years old. I initially saw him about 3 years ago for dyspnea. At that time cardiac workup showed normal left ventricular systolic and diastolic function and mild aortic stenosis. A nuclear stress test was normal. He has a chronic right bundle branch block. Most of his problems at that time actually appeared to center around Parkinson's disease and knee pain.  He seems to have deteriorated over the last 3 years. He has had numerous falls including 2 that had very severe consequences. One fall led to placement of a pin between cervical vertebrae C5 and 6. The second fall led to a fracture of the first cervical vertebra and he had to wear a brace for 3 months. Surgery was considered but deferred.  His falls usually occur when he first gets out of bed in the morning. He has numerous symptoms strongly suggestive of orthostatic hypotension. He was not aware that both Flomax and Sinemet can cause orthostatic hypotension  Dyspnea appears to be a minor complaint now, possibly because he is very inactive. His atenolol was stopped due to the falls. He has very frequent PVCs but these appear to be asymptomatic.  If he does not take Flomax he has severe difficulty urinating. Has had some hallucinations, which his wife thinks have an association with his Parkinson's medicine.    Allergies  Allergen Reactions  . Adhesive [Tape]   . Lasix [Furosemide] Rash    Current Outpatient Prescriptions  Medication Sig Dispense Refill  . acetaminophen (TYLENOL) 500 MG tablet Take 500 mg by mouth every 6 (six) hours as needed for pain.      Marland Kitchen ALPRAZolam (NIRAVAM) 0.25 MG dissolvable tablet Take 0.25 mg by mouth at bedtime as needed.      Marland Kitchen aspirin 81 MG tablet Take 81 mg by mouth daily.       . carbidopa-levodopa (SINEMET  IR) 25-100 MG per tablet Take 1.5 tablets by mouth 4 (four) times daily. Pt takes med @@ 0700, 1200, 1600, 1900      . cyclobenzaprine (FLEXERIL) 10 MG tablet Take 10 mg by mouth 3 (three) times daily as needed for muscle spasms.      Marland Kitchen ibuprofen (ADVIL,MOTRIN) 200 MG tablet Take 200 mg by mouth 3 (three) times daily.      Marland Kitchen levothyroxine (SYNTHROID, LEVOTHROID) 25 MCG tablet Take 25 mcg by mouth daily.      . Melatonin 10 MG CAPS Take 10 mg by mouth at bedtime.      Marland Kitchen omeprazole (PRILOSEC) 20 MG capsule Take 20 mg by mouth daily.      . QUEtiapine (SEROQUEL) 50 MG tablet Take 50 mg by mouth at bedtime.      . tamsulosin (FLOMAX) 0.4 MG CAPS Take 0.4 mg by mouth daily after supper.      . venlafaxine (EFFEXOR) 75 MG tablet Take 75 mg by mouth 2 (two) times daily.      . vitamin B-12 (CVS VITAMIN B12) 250 MCG tablet Take 250 mcg by mouth daily.       No current facility-administered medications for this visit.    Past Medical History  Diagnosis Date  . Parkinson disease   . GERD (gastroesophageal reflux disease)   . Hypertension   . Hypothyroidism     takes meds,  .  Cancer     prostate  . Neuromuscular disorder     PARKINSONS  . Anxiety   . RBBB (right bundle branch block)   . CHF (congestive heart failure)   . Dyspnea on exertion   . Aortic valve disorder     Past Surgical History  Procedure Laterality Date  . Prostate surgery    . Appendectomy    . Cervical discectomy  02/18/2012     C 5 C6    . Anterior cervical decomp/discectomy fusion  02/18/2012    Procedure: ANTERIOR CERVICAL DECOMPRESSION/DISCECTOMY FUSION 1 LEVEL;  Surgeon: Karn Cassis, MD;  Location: [facility];  Service: Neurosurgery;  Laterality: N/A;  Cervical five-six Anterior cervical decompression/diskectomy, fusion, plate  . Persantine myoview stress test  08/27/2010    Normal myocardial perfusion study. No EKG changes.  . 2d echocardiogram  09/01/2011    EF >55%, normal-mild    Family History    Problem Relation Age of Onset  . Anesthesia problems Neg Hx   . Hypotension Neg Hx   . Malignant hyperthermia Neg Hx   . Pseudochol deficiency Neg Hx   . Kidney disease Mother   . Heart disease Father   . Heart attack Brother     History   Social History  . Marital Status: Married    Spouse Name: Kathie Rhodes    Number of Children: 1  . Years of Education: HS   Occupational History  . Not on file.   Social History Main Topics  . Smoking status: Former Smoker -- 1.00 packs/day for 25 years  . Smokeless tobacco: Current User    Types: Chew  . Alcohol Use: No  . Drug Use: No  . Sexual Activity: Not Currently   Other Topics Concern  . Not on file   Social History Narrative   Pt lives at home with spouse.   Caffeine Use: none    Review of systems: The patient specifically denies any chest pain at rest or with exertion, dyspnea at rest, orthopnea, paroxysmal nocturnal dyspnea, syncope, palpitations, focal neurological deficits, intermittent claudication, lower extremity edema, unexplained weight gain, cough, hemoptysis or wheezing.  The patient also denies abdominal pain, nausea, vomiting, dysphagia, diarrhea, constipation, polyuria, polydipsia, dysuria, hematuria, frequency, urgency, abnormal bleeding or bruising, fever, chills, unexpected weight changes, mood swings, change in skin or hair texture, change in voice quality, auditory or visual problems, allergic reactions or rashes, new musculoskeletal complaints other than usual "aches and pains", initially in his right knee   PHYSICAL EXAM BP 136/60  Pulse 79  Resp 16  Ht 5\' 8"  (1.727 m)  Wt 172 lb 14.4 oz (78.427 kg)  BMI 26.3 kg/m2  General: Alert, oriented x3, no distress, his face is fairly an expressive consistent with Parkinson's disease; he has a takes before beginning to speak. His wife does most of the talking. Head: no evidence of trauma, PERRL, EOMI, no exophtalmos or lid lag, no myxedema, no xanthelasma; normal  ears, nose and oropharynx Neck: normal jugular venous pulsations and no hepatojugular reflux; brisk carotid pulses without delay and no carotid bruits Chest: clear to auscultation, no signs of consolidation by percussion or palpation, normal fremitus, symmetrical and full respiratory excursions Cardiovascular: normal position and quality of the apical impulse, regular rhythm, normal first and second heart sounds, no murmurs, rubs or gallops Abdomen: no tenderness or distention, no masses by palpation, no abnormal pulsatility or arterial bruits, normal bowel sounds, no hepatosplenomegaly Extremities: no clubbing, cyanosis or edema; 2+  radial, ulnar and brachial pulses bilaterally; 2+ right femoral, posterior tibial and dorsalis pedis pulses; 2+ left femoral, posterior tibial and dorsalis pedis pulses; no subclavian or femoral bruits Neurological: Limited facial expression, fine resting tremor, otherwise nonfocal. Shuffling hesitant gait.   EKG: Sinus rhythm, frequent PVCs, right bundle branch block  Lipid Panel  No results found for this basename: chol, trig, hdl, cholhdl, vldl, ldlcalc    BMET    Component Value Date/Time   NA 142 02/13/2013 2002   K 3.9 02/13/2013 2002   CL 105 02/13/2013 2002   CO2 27 02/21/2012 0845   GLUCOSE 92 02/13/2013 2002   BUN 28* 02/13/2013 2002   CREATININE 1.20 02/13/2013 2002   CALCIUM 9.5 02/21/2012 0845   GFRNONAA 56* 02/21/2012 0845   GFRAA 64* 02/21/2012 0845     ASSESSMENT AND PLAN Orthostatic hypotension His movement disorder clearly is contributing to his falls, but I believe that orthostatic hypotension is also playing an important role. He spent a long time discussing the need to stand at the bedside without moving for a while until he starts walking. I explored the options of discontinuing medications that might worsen his orthostasis but clearly he needs both the Sinemet and the Flomax. Fracture or can also cause orthostatic hypotension but to  Dr.Athar's note and Dr. Imagene Gurney previous evaluation suggest that they believe his delusions are actually secondary to depression and therefore this medication might be very important for him. We'll have to try a more conservative ways of treating the problem. I've recommended a relatively salt rich diet and aggressive hydration. He would be beneficial that he wear thigh-high support stockings but both the patient and his wife expressed some dismay at how he would have to put these on.   Orders Placed This Encounter  Procedures  . EKG 12-Lead   No orders of the defined types were placed in this encounter.    Junious Silk, MD, [facility] [phone] office [phone] pager

## 2013-07-26 ENCOUNTER — Ambulatory Visit: Payer: Medicare Other | Admitting: Neurology

## 2013-07-26 ENCOUNTER — Encounter: Payer: Self-pay | Admitting: Neurology

## 2013-07-26 ENCOUNTER — Encounter (INDEPENDENT_AMBULATORY_CARE_PROVIDER_SITE_OTHER): Payer: Self-pay

## 2013-07-26 ENCOUNTER — Ambulatory Visit (INDEPENDENT_AMBULATORY_CARE_PROVIDER_SITE_OTHER): Payer: Medicare Other | Admitting: Neurology

## 2013-07-26 VITALS — BP 131/75 | HR 74 | Ht 68.0 in | Wt 168.0 lb

## 2013-07-26 DIAGNOSIS — Z9181 History of falling: Secondary | ICD-10-CM

## 2013-07-26 DIAGNOSIS — R296 Repeated falls: Secondary | ICD-10-CM

## 2013-07-26 DIAGNOSIS — R269 Unspecified abnormalities of gait and mobility: Secondary | ICD-10-CM

## 2013-07-26 DIAGNOSIS — F0280 Dementia in other diseases classified elsewhere without behavioral disturbance: Secondary | ICD-10-CM

## 2013-07-26 DIAGNOSIS — S129XXS Fracture of neck, unspecified, sequela: Secondary | ICD-10-CM

## 2013-07-26 DIAGNOSIS — R443 Hallucinations, unspecified: Secondary | ICD-10-CM

## 2013-07-26 DIAGNOSIS — G20 Parkinson's disease: Secondary | ICD-10-CM

## 2013-07-26 NOTE — Patient Instructions (Addendum)
I think your Parkinson's disease has remained fairly stable, which is reassuring. Nevertheless, as you know, this disease does progress with time. It can affect your balance, your memory, your mood, your bowel and bladder function, your posture, balance and walking. Overall you are doing fairly well but I do want to suggest a few things today:  Remember to drink plenty of fluid, eat healthy meals and do not skip any meals. Try to eat protein with a every meal and eat a healthy snack such as fruit or nuts in between meals. Try to keep a regular sleep-wake schedule and try to exercise daily, particularly in the form of walking, 20-30 minutes a day, if you can.   Taking your medication on schedule is key.   Try to stay active physically and mentally. Engage in social activities in your community and with your family and try to keep up with current events by reading the newspaper or watching the news.   As far as your medications are concerned, I would like to suggest that you take your current medication with the following additional changes: try to reduce Melatonin to 5 mg each night. Try to look for a chewable kind or liquid kind.      As far as diagnostic testing, I will order: no new test.   You have to use your walker at all times! You have fallen and injured yourself several times.   I would like to see you back in 4 months, sooner if we need to. Please call us with any interim questions, concerns, problems, updates or refill requests.  Brett Canales is my clinical assistant and will answer any of your questions and relay your messages to me and also relay most of my messages to you.  Our phone number is [phone]. We also have an after hours call service for urgent matters and there is a physician on-call for urgent questions, that cannot wait till the next work day. For any emergencies you know to call 911 or go to the nearest emergency room.

## 2013-07-26 NOTE — Progress Notes (Signed)
Subjective:    Patient ID: Albert Pope is a 79 y.o. male.  HPI  Interim history:   Albert Pope is a very pleasant 79 year old right-handed gentleman who presents for followup consultation of his advanced Parkinson's disease, complicated by memory loss, orthostatic hypotension, hallucinations and dyskinesias and recurrent falls. He is accompanied by his wife again today. I first met him on 04/08/2013, which time I did not make any changes to his medications but talk to them about gait safety. He had to wear a hard neck collar for about 2 months d/t his C1 Fx, but it healed incompletely, per wife and Dr. Jeral Fruit did not suggest any surgical intervention. He had repeat cervical spine X ray on 05/09/13 which showed no worrisome new findings. He has not been using his walker. He feels, he can do without. I had asked him to use his walker at all times. He has had some constipation.  He previously followed by Dr. Avie Echevaria and was last seen by him on 12/07/2012, at which time Dr. Sandria Manly felt that he had significant underlying depression and was having delusions secondary to depression or dementia. He was referred to psychiatry at the time. He had significant sialorrhea for which he was going to get Botox injections under Dr. Lorin Picket at [facility]. He has an underlying medical history of prostate cancer, depression, dementia, hypothyroidism, arthritis. He is currently on Seroquel 12.5 mg at night, Effexor XR 75 mg daily, Flomax, Xanax 0.25 mg at bedtime, Melatonin 20 mg qHS, Sinemet 25-100 mg strength one and half tablets at 8 AM and 1 at 11 AM, 1 at 4 PM and 1 at 7 PM, B12 injections once monthly, atenolol 12.5 mg daily, omeprazole, levothyroxine, ibuprofen as needed and baby aspirin daily.  He has an 11 year history of idiopathic Parkinson's disease, right-sided predominant, complicated with dyskinesias and hallucinations and memory loss. He had significant nausea on Requip. He has had some delusions regarding losing  his house. He developed vertigo in May 2011. He had a CT head at the time which was normal. MRI brain without contrast in May 2011 showed changes of chronic microvascular ischemia and generalized atrophy, MRA head showed no significant large vessel stenosis. He echocardiogram and stress test for workup of shortness of breath which were normal. He fell in May 2013 and hit his head. CT head showed chronic atrophy and small vessel changes. C-spine x-ray showed degenerative changes without fracture. MRI C-spine showed C5-6 herniated disc and the patient was admitted for surgery in 5/13. In March 2014 his MMSE was 19, clock drawing was two out of four, animal fluency was 4.  He has since then fallen several times and fell about 6 weeks ago and fractured C1 and has seen Dr. Jeral Fruit, who has issues a hard collar, which he has to wear 24/7 for at least 8 weeks. He fell this morning while getting ready. He fell on carpet onto the R leg. He does not use his walker at home as the walker is rather large.  He saw Dr. Donell Beers in psychiatry. He mostly falls when getting out of bed. He has multiple canes and 2 walkers. They have 2 aids, one comes 6h/week and the other comes 7h/week.  His last C spine from 03/14/13 CT showed: Unchanged right C1 anterior and posterior arch fractures. No interval healing. Unchanged mild lateral displacement C1 lateral mass. He has an appt with Dr. Jeral Fruit on 05/09/13.  His last CTH was in May showing: Posterior parietal scalp  hematoma. No other acute abnormality.  His Past Medical History Is Significant For: Past Medical History  Diagnosis Date  . Parkinson disease   . GERD (gastroesophageal reflux disease)   . Hypertension   . Hypothyroidism     takes meds,  . Cancer     prostate  . Neuromuscular disorder     PARKINSONS  . Anxiety   . RBBB (right bundle branch block)   . CHF (congestive heart failure)   . Dyspnea on exertion   . Aortic valve disorder     His Past Surgical  History Is Significant For: Past Surgical History  Procedure Laterality Date  . Prostate surgery    . Appendectomy    . Cervical discectomy  02/18/2012     C 5 C6    . Anterior cervical decomp/discectomy fusion  02/18/2012    Procedure: ANTERIOR CERVICAL DECOMPRESSION/DISCECTOMY FUSION 1 LEVEL;  Surgeon: Karn Cassis, MD;  Location: [facility];  Service: Neurosurgery;  Laterality: N/A;  Cervical five-six Anterior cervical decompression/diskectomy, fusion, plate  . Persantine myoview stress test  08/27/2010    Normal myocardial perfusion study. No EKG changes.  . 2d echocardiogram  09/01/2011    EF >55%, normal-mild    His Family History Is Significant For: Family History  Problem Relation Age of Onset  . Anesthesia problems Neg Hx   . Hypotension Neg Hx   . Malignant hyperthermia Neg Hx   . Pseudochol deficiency Neg Hx   . Kidney disease Mother   . Heart disease Father   . Heart attack Brother     His Social History Is Significant For: History   Social History  . Marital Status: Married    Spouse Name: Kathie Rhodes    Number of Children: 1  . Years of Education: HS   Social History Main Topics  . Smoking status: Former Smoker -- 1.00 packs/day for 25 years  . Smokeless tobacco: Current User    Types: Chew  . Alcohol Use: No  . Drug Use: No  . Sexual Activity: Not Currently   Other Topics Concern  . None   Social History Narrative   Pt lives at home with spouse.   Caffeine Use: none    His Allergies Are:  Allergies  Allergen Reactions  . Adhesive [Tape]   . Lasix [Furosemide] Rash  :   His Current Medications Are:  Outpatient Encounter Prescriptions as of 07/26/2013  Medication Sig Dispense Refill  . ALPRAZolam (NIRAVAM) 0.25 MG dissolvable tablet Take 0.25 mg by mouth at bedtime as needed.      Marland Kitchen aspirin 81 MG tablet Take 81 mg by mouth daily.       . carbidopa-levodopa (SINEMET IR) 25-100 MG per tablet Take 1.5 tablets by mouth 4 (four) times daily. Pt  takes med @@ 0700, 1200, 1600, 1900      . ibuprofen (ADVIL,MOTRIN) 200 MG tablet Take 200 mg by mouth 3 (three) times daily.      Marland Kitchen levothyroxine (SYNTHROID, LEVOTHROID) 25 MCG tablet Take 25 mcg by mouth daily.      . Melatonin 10 MG CAPS Take 10 mg by mouth at bedtime.      Marland Kitchen omeprazole (PRILOSEC) 20 MG capsule Take 20 mg by mouth daily.      . QUEtiapine (SEROQUEL) 50 MG tablet Take 100 mg by mouth at bedtime.       . tamsulosin (FLOMAX) 0.4 MG CAPS Take 0.4 mg by mouth daily after supper.      Marland Kitchen  venlafaxine (EFFEXOR) 75 MG tablet Take 75 mg by mouth 2 (two) times daily.      . vitamin B-12 ([employer] VITAMIN B12) 250 MCG tablet Take 250 mcg by mouth daily.      . [DISCONTINUED] acetaminophen (TYLENOL) 500 MG tablet Take 500 mg by mouth every 6 (six) hours as needed for pain.      . [DISCONTINUED] cyclobenzaprine (FLEXERIL) 10 MG tablet Take 10 mg by mouth 3 (three) times daily as needed for muscle spasms.       No facility-administered encounter medications on file as of 07/26/2013.  :  Review of Systems:  Out of a complete 14 point review of systems, all are reviewed and negative with the exception of these symptoms as listed below:   Review of Systems  Constitutional: Positive for activity change, appetite change and fatigue.  HENT: Positive for hearing loss and trouble swallowing.   Eyes: Positive for visual disturbance (diplopia).  Respiratory: Negative.   Cardiovascular: Negative.   Gastrointestinal: Positive for constipation.  Endocrine: Positive for cold intolerance.  Genitourinary: Negative.   Musculoskeletal: Negative.   Skin: Negative.   Allergic/Immunologic: Negative.   Neurological: Positive for dizziness, tremors, speech difficulty and weakness.       Memory loss, restless leg  Hematological: Bruises/bleeds easily.  Psychiatric/Behavioral: Positive for hallucinations. The patient is nervous/anxious.     Objective:  Neurologic Exam  Physical Exam Physical  Examination:   Filed Vitals:   07/26/13 1549  BP: 131/75  Pulse: 74     General Examination: The patient is a very pleasant 79 y.o. male in no acute distress.  HEENT: Normocephalic, atraumatic, pupils are equal, round and reactive to light and accommodation. Extraocular tracking shows mild saccadic breakdown without nystagmus noted. There is limitation to upper gaze. There is mild decrease in eye blink rate. Hearing is impaired with hearing aids in place b/l. Face is symmetric with mild facial masking and normal facial sensation. There is no lip, neck or jaw tremor. Neck is not moved passively by me today and he is wearing a rigid collar. There are no carotid bruits on auscultation. Oropharynx exam reveals mild mouth dryness. No significant airway crowding is noted. Mallampati is class II. Tongue protrudes centrally and palate elevates symmetrically. There is mild nasal discharge.   Chest: is clear to auscultation without wheezing, rhonchi or crackles noted.  Heart: sounds are regular and normal without murmurs, rubs or gallops noted.   Abdomen: is soft, non-tender and non-distended with normal bowel sounds appreciated on auscultation.  Extremities: There is no pitting edema in the distal lower extremities bilaterally. There are no varicose veins.  Skin: is warm and dry with no trophic changes noted. Age-related changes are noted on the skin.   Musculoskeletal: exam reveals no obvious joint deformities, tenderness, joint swelling or erythema.  Neurologically:  Mental status: The patient is awake and alert, paying fair attention. He is unable to provide the history. His wife provides the entire Hx. He is oriented to: person, place, situation, day of week, month of year and year. His memory, attention, language and knowledge are impaired. There is no aphasia, agnosia, apraxia or anomia. There is a mild degree of bradyphrenia. Speech is moderately hypophonic with moderate dysarthria noted.  Mood is congruent and affect is normal.   Cranial nerves are as described above under HEENT exam. In addition, shoulder shrug is normal with equal shoulder height noted.  Motor exam: Normal bulk, and strength for age is noted. There are no  dyskinesias noted.  Tone is mildly rigid with absence of cogwheeling. There is overall mild bradykinesia. There is no drift or rebound.  There is an intermittent RUE resting tremor and a slight LUE resting tremor. Reflexes are 1+ in the upper extremities and trace in the lower extremities. Fine motor skills exam: Finger taps are moderately impaired on the right and moderately impaired on the left. Hand movements are mildly impaired on the right and moderately impaired on the left. RAP (rapid alternating patting) is mildly impaired on the right and mildly impaired on the left. Foot taps are moderately impaired on the right and mildly impaired on the left. Foot agility (in the form of heel stomping) is severely impaired on the right and moderately impaired on the left.    Cerebellar testing shows no dysmetria or intention tremor on finger to nose testing. There is no truncal or gait ataxia.   Sensory exam is intact to light touch.   Gait, station and balance: He stands up from the seated position with mild difficulty and does need to push up with His hands. He needs little assistance. No veering to one side is noted. He is not noted to lean to the side. Posture is moderately stooped with increase in cervical kyphosis. Stance is wide-based. He walks with decrease in stride length and pace and decreased arm swing. He turns in 3 steps. Tandem walk is not possible. Balance is moderately impaired. He is not able to do a toe or heel stance. He did not bring his walker.    Assessment and Plan:   In summary, ERMIN PARISIEN is a very pleasant 79 year old male with a history of advanced Parkinson's disease, right-sided predominant, complicated by dyskinesias,  hallucinations, poor medication tolerance, gait disorder, balance problems, recurrent falls with recent injury to his neck. I again had a very long chat with the patient and his wife regarding advance Parkinson's disease, its prognosis and treatment options. We are somewhat limited in our options because of his advanced age and risk for side effects with medications. I explained to him again that he is at significant risk for falling and needs to use his walker at all times. He is advised not to get out of bed by himself at any point in time anymore. I did not make any changes to his medications and would like to see him back in 4 months, sooner if the need arises. I would not suggest increasing his Seroquel or Xanax d/t sedation and risk for worsening balance and gait. He did not require any refills but he gets his medications through the [place]. Once he needs a prescription refill we can fax her prescription or send him a hard copy for the Sinemet and the Seroquel. He and his wife were advised about fall risk and the need to use the walker even in the house and that he should not get out of bed by himself. They understood and were in agreement.  Most of my 35 minute visit today was spent in counseling and coordination of care, reviewing test results and reviewing medication.

## 2013-07-29 ENCOUNTER — Ambulatory Visit: Payer: Medicare Other | Admitting: Neurology

## 2013-08-30 ENCOUNTER — Emergency Department (HOSPITAL_COMMUNITY): Payer: Medicare Other

## 2013-08-30 ENCOUNTER — Encounter (HOSPITAL_COMMUNITY): Payer: Self-pay | Admitting: Emergency Medicine

## 2013-08-30 ENCOUNTER — Emergency Department (HOSPITAL_COMMUNITY)
Admission: EM | Admit: 2013-08-30 | Discharge: 2013-08-30 | Disposition: A | Discharge status: Home / Self Care | Payer: Medicare Other | Attending: Emergency Medicine | Admitting: Emergency Medicine

## 2013-08-30 DIAGNOSIS — F411 Generalized anxiety disorder: Secondary | ICD-10-CM | POA: Insufficient documentation

## 2013-08-30 DIAGNOSIS — Z8709 Personal history of other diseases of the respiratory system: Secondary | ICD-10-CM | POA: Insufficient documentation

## 2013-08-30 DIAGNOSIS — F0390 Unspecified dementia without behavioral disturbance: Secondary | ICD-10-CM | POA: Insufficient documentation

## 2013-08-30 DIAGNOSIS — Z8546 Personal history of malignant neoplasm of prostate: Secondary | ICD-10-CM | POA: Insufficient documentation

## 2013-08-30 DIAGNOSIS — Z043 Encounter for examination and observation following other accident: Secondary | ICD-10-CM | POA: Insufficient documentation

## 2013-08-30 DIAGNOSIS — W19XXXA Unspecified fall, initial encounter: Secondary | ICD-10-CM

## 2013-08-30 DIAGNOSIS — F0280 Dementia in other diseases classified elsewhere without behavioral disturbance: Secondary | ICD-10-CM | POA: Insufficient documentation

## 2013-08-30 DIAGNOSIS — Y92009 Unspecified place in unspecified non-institutional (private) residence as the place of occurrence of the external cause: Secondary | ICD-10-CM | POA: Insufficient documentation

## 2013-08-30 DIAGNOSIS — E039 Hypothyroidism, unspecified: Secondary | ICD-10-CM | POA: Insufficient documentation

## 2013-08-30 DIAGNOSIS — Z7982 Long term (current) use of aspirin: Secondary | ICD-10-CM | POA: Insufficient documentation

## 2013-08-30 DIAGNOSIS — G309 Alzheimer's disease, unspecified: Secondary | ICD-10-CM | POA: Insufficient documentation

## 2013-08-30 DIAGNOSIS — I509 Heart failure, unspecified: Secondary | ICD-10-CM | POA: Insufficient documentation

## 2013-08-30 DIAGNOSIS — Y9389 Activity, other specified: Secondary | ICD-10-CM | POA: Insufficient documentation

## 2013-08-30 DIAGNOSIS — K219 Gastro-esophageal reflux disease without esophagitis: Secondary | ICD-10-CM | POA: Insufficient documentation

## 2013-08-30 DIAGNOSIS — Z87891 Personal history of nicotine dependence: Secondary | ICD-10-CM | POA: Insufficient documentation

## 2013-08-30 DIAGNOSIS — I10 Essential (primary) hypertension: Secondary | ICD-10-CM | POA: Insufficient documentation

## 2013-08-30 DIAGNOSIS — R296 Repeated falls: Secondary | ICD-10-CM | POA: Insufficient documentation

## 2013-08-30 DIAGNOSIS — Z79899 Other long term (current) drug therapy: Secondary | ICD-10-CM | POA: Insufficient documentation

## 2013-08-30 NOTE — ED Notes (Signed)
Patient transported to X-ray 

## 2013-08-30 NOTE — ED Notes (Signed)
Patient transported to CT 

## 2013-08-30 NOTE — ED Notes (Signed)
Bed: [facility] Expected date:  Expected time:  Means of arrival:  Comments: 

## 2013-08-30 NOTE — ED Provider Notes (Signed)
CSN: [id number]     Arrival date & time 08/30/13  1048 History   First MD Initiated Contact with Patient 08/30/13 1221     Chief Complaint  Patient presents with  . Fall   (Consider location/radiation/quality/duration/timing/severity/associated sxs/prior Treatment) Patient is a 79 y.o. male presenting with fall.  Fall   Patient presents after a mechanical fall with familial concerns of injury. Patient's Parkinson's, poor memory.  He cannot describe actual fall.  The falls witnessed by his wife.  Seems as though the patient tripped, fell from approximately standing height onto his backside.  He has been ambulatory since the fall.  He has minimal complaints himself.  He does have a notable history of Parkinson's disease, prior cervical spine fracture. He has been moving his extremities spontaneously.  He denies complaints, but again is a poor historian.  Past Medical History  Diagnosis Date  . Parkinson disease   . GERD (gastroesophageal reflux disease)   . Hypertension   . Hypothyroidism     takes meds,  . Cancer     prostate  . Neuromuscular disorder     PARKINSONS  . Anxiety   . RBBB (right bundle branch block)   . CHF (congestive heart failure)   . Dyspnea on exertion   . Aortic valve disorder    Past Surgical History  Procedure Laterality Date  . Prostate surgery    . Appendectomy    . Cervical discectomy  02/18/2012     C 5 C6    . Anterior cervical decomp/discectomy fusion  02/18/2012    Procedure: ANTERIOR CERVICAL DECOMPRESSION/DISCECTOMY FUSION 1 LEVEL;  Surgeon: Karn Cassis, MD;  Location: [facility];  Service: Neurosurgery;  Laterality: N/A;  Cervical five-six Anterior cervical decompression/diskectomy, fusion, plate  . Persantine myoview stress test  08/27/2010    Normal myocardial perfusion study. No EKG changes.  . 2d echocardiogram  09/01/2011    EF >55%, normal-mild   Family History  Problem Relation Age of Onset  . Anesthesia problems Neg Hx   .  Hypotension Neg Hx   . Malignant hyperthermia Neg Hx   . Pseudochol deficiency Neg Hx   . Kidney disease Mother   . Heart disease Father   . Heart attack Brother    History  Substance Use Topics  . Smoking status: Former Smoker -- 1.00 packs/day for 25 years  . Smokeless tobacco: Current User    Types: Chew  . Alcohol Use: No    Review of Systems  Unable to perform ROS: Dementia    Allergies  Adhesive and Lasix  Home Medications   Current Outpatient Rx  Name  Route  Sig  Dispense  Refill  . ALPRAZolam (NIRAVAM) 0.25 MG dissolvable tablet   Oral   Take 0.25 mg by mouth at bedtime as needed.         Marland Kitchen aspirin 81 MG tablet   Oral   Take 81 mg by mouth daily.          . carbidopa-levodopa (SINEMET IR) 25-100 MG per tablet   Oral   Take 1.5 tablets by mouth 4 (four) times daily. Pt takes med @@ 0700, 1200, 1600, 1900         . ibuprofen (ADVIL,MOTRIN) 200 MG tablet   Oral   Take 200 mg by mouth 3 (three) times daily.         Marland Kitchen levothyroxine (SYNTHROID, LEVOTHROID) 25 MCG tablet   Oral   Take 25 mcg by mouth daily.         Marland Kitchen  Melatonin 10 MG CAPS   Oral   Take 10 mg by mouth at bedtime.         Marland Kitchen omeprazole (PRILOSEC) 20 MG capsule   Oral   Take 20 mg by mouth daily.         . QUEtiapine (SEROQUEL) 50 MG tablet   Oral   Take 100 mg by mouth at bedtime.          . tamsulosin (FLOMAX) 0.4 MG CAPS   Oral   Take 0.4 mg by mouth daily after supper.         . venlafaxine (EFFEXOR) 75 MG tablet   Oral   Take 75 mg by mouth 2 (two) times daily.         . vitamin B-12 ([employer]S VITAMIN B12) 250 MCG tablet   Oral   Take 250 mcg by mouth daily.          BP 147/114  Pulse 75  Temp(Src) 98.1 F (36.7 C) (Oral)  Resp 20  SpO2 99% Physical Exam  Nursing note and vitals reviewed. Constitutional: He appears well-developed. Cervical collar in place.  HENT:  Head: Normocephalic and atraumatic.  Eyes: Conjunctivae and EOM are normal.    Cardiovascular: Normal rate and regular rhythm.   Pulmonary/Chest: Effort normal. No stridor. No respiratory distress.  Abdominal: He exhibits no distension.  Musculoskeletal: He exhibits no edema.       Legs: Patient flexes and extends both hips, shoulders freely, without describing pain.  No gross deformities throughout.  Neurological: He is alert.  Follows commands inconsistently.  He does move all extremities spontaneously.  No facial asymmetry.  Speech is clear  Skin: Skin is warm and dry.  Psychiatric: He has a normal mood and affect. Cognition and memory are impaired.    ED Course  Procedures (including critical care time) Labs Review Labs Reviewed - No data to display Imaging Review No results found.  EKG Interpretation   None      3:16 PM On repeat exam the patient is sitting upright.  He, his wife, myself discussed all results. They note that following his original C1 Fx, he wore a Aspen collar for 3 months.  After no evidence of significant improvement his neurosurgeon advised him to take it off.  On repeat exam he appears overall, denies complaints.  He is ambulatory. MDM  No diagnosis found. Patient presents after a fall.  With the patient's inability to describe the event he had radiographic studies performed.  These were largely reassuring, though there is continued evidence of nonhealing of the fracture.  However, the patient's condition is at baseline, he is ambulatory, with no complaints, is hemodynamically stable.  Patient was discharged to follow up with his neurosurgeon, primary care physician    Gerhard Munch, MD 08/30/13 [phone]

## 2013-08-30 NOTE — ED Notes (Signed)
Pt from home, wife states he broke 3 bones in his neck a while back and she thinks he may have reinjured them from a fall this morning. Pt was attempting to sit in chair and missed the chair and hit the floor. Pt has parkinson.

## 2013-08-30 NOTE — ED Notes (Signed)
MD at bedside. 

## 2013-08-30 NOTE — ED Notes (Signed)
MD at bedside.  EDP [facility] AT BS  WIFE REPORT COLLAR PLACED BY HER FROM HOME FOR COMFORT.

## 2013-09-29 ENCOUNTER — Other Ambulatory Visit: Payer: Self-pay | Admitting: Neurology

## 2013-09-29 MED ORDER — CARBIDOPA-LEVODOPA 25-100 MG PO TABS: 1.5000 | ORAL_TABLET | Freq: Four times a day (QID) | ORAL | Status: DC

## 2013-11-04 ENCOUNTER — Emergency Department (HOSPITAL_COMMUNITY)
Admission: EM | Admit: 2013-11-04 | Discharge: 2013-11-04 | Disposition: A | Discharge status: Home / Self Care | Payer: Medicare Other | Attending: Emergency Medicine | Admitting: Emergency Medicine

## 2013-11-04 ENCOUNTER — Emergency Department (HOSPITAL_COMMUNITY): Payer: Medicare Other

## 2013-11-04 ENCOUNTER — Encounter (HOSPITAL_COMMUNITY): Payer: Self-pay | Admitting: Emergency Medicine

## 2013-11-04 DIAGNOSIS — S0083XA Contusion of other part of head, initial encounter: Secondary | ICD-10-CM

## 2013-11-04 DIAGNOSIS — S1093XA Contusion of unspecified part of neck, initial encounter: Secondary | ICD-10-CM

## 2013-11-04 DIAGNOSIS — F411 Generalized anxiety disorder: Secondary | ICD-10-CM | POA: Insufficient documentation

## 2013-11-04 DIAGNOSIS — Y929 Unspecified place or not applicable: Secondary | ICD-10-CM | POA: Insufficient documentation

## 2013-11-04 DIAGNOSIS — E039 Hypothyroidism, unspecified: Secondary | ICD-10-CM | POA: Insufficient documentation

## 2013-11-04 DIAGNOSIS — G20A1 Parkinson's disease without dyskinesia, without mention of fluctuations: Secondary | ICD-10-CM | POA: Insufficient documentation

## 2013-11-04 DIAGNOSIS — R011 Cardiac murmur, unspecified: Secondary | ICD-10-CM | POA: Insufficient documentation

## 2013-11-04 DIAGNOSIS — I10 Essential (primary) hypertension: Secondary | ICD-10-CM | POA: Insufficient documentation

## 2013-11-04 DIAGNOSIS — I359 Nonrheumatic aortic valve disorder, unspecified: Secondary | ICD-10-CM | POA: Insufficient documentation

## 2013-11-04 DIAGNOSIS — I4510 Unspecified right bundle-branch block: Secondary | ICD-10-CM | POA: Insufficient documentation

## 2013-11-04 DIAGNOSIS — G20 Parkinson's disease: Secondary | ICD-10-CM | POA: Insufficient documentation

## 2013-11-04 DIAGNOSIS — Z9181 History of falling: Secondary | ICD-10-CM | POA: Insufficient documentation

## 2013-11-04 DIAGNOSIS — W06XXXA Fall from bed, initial encounter: Secondary | ICD-10-CM | POA: Insufficient documentation

## 2013-11-04 DIAGNOSIS — I509 Heart failure, unspecified: Secondary | ICD-10-CM | POA: Insufficient documentation

## 2013-11-04 DIAGNOSIS — Y939 Activity, unspecified: Secondary | ICD-10-CM | POA: Insufficient documentation

## 2013-11-04 DIAGNOSIS — S0990XA Unspecified injury of head, initial encounter: Secondary | ICD-10-CM | POA: Insufficient documentation

## 2013-11-04 DIAGNOSIS — S0003XA Contusion of scalp, initial encounter: Secondary | ICD-10-CM | POA: Insufficient documentation

## 2013-11-04 DIAGNOSIS — Z87891 Personal history of nicotine dependence: Secondary | ICD-10-CM | POA: Insufficient documentation

## 2013-11-04 DIAGNOSIS — Z7982 Long term (current) use of aspirin: Secondary | ICD-10-CM | POA: Insufficient documentation

## 2013-11-04 DIAGNOSIS — Z8546 Personal history of malignant neoplasm of prostate: Secondary | ICD-10-CM | POA: Insufficient documentation

## 2013-11-04 DIAGNOSIS — K219 Gastro-esophageal reflux disease without esophagitis: Secondary | ICD-10-CM | POA: Insufficient documentation

## 2013-11-04 DIAGNOSIS — F0390 Unspecified dementia without behavioral disturbance: Secondary | ICD-10-CM | POA: Insufficient documentation

## 2013-11-04 DIAGNOSIS — Z79899 Other long term (current) drug therapy: Secondary | ICD-10-CM | POA: Insufficient documentation

## 2013-11-04 DIAGNOSIS — Z91040 Latex allergy status: Secondary | ICD-10-CM | POA: Insufficient documentation

## 2013-11-04 NOTE — ED Notes (Signed)
MD at bedside. 

## 2013-11-04 NOTE — Discharge Instructions (Signed)
Please use Tylenol or ibuprofen for pain. The CT scan of the brain and spine show no bleeding and no new fractures.  Please call your doctor for a followup appointment within 24-48 hours. When you talk to your doctor please let them know that you were seen in the emergency department and have them acquire all of your records so that they can discuss the findings with you and formulate a treatment plan to fully care for your new and ongoing problems.

## 2013-11-04 NOTE — ED Notes (Signed)
Wife states pt got up tonight and fell at the foot of the bed  Pt has a raised area on the right side of his head with a little bleeding noted  Pt is not on blood thinners  Wife states this is the third time he fell lately  No other injuries to note  Denies LOC

## 2013-11-04 NOTE — ED Provider Notes (Signed)
CSN: [id number]     Arrival date & time 11/04/13  0217 History   First MD Initiated Contact with Patient 11/04/13 0221     Chief Complaint  Patient presents with  . Fall   (Consider location/radiation/quality/duration/timing/severity/associated sxs/prior Treatment) HPI Comments: Albert Pope is an 80 year old male with a history of Parkinson's disease as well as dementia. The patient does not remember his fall but his wife states that she heard him fall at the base of the bed, when she saw him on the floor he was in the sitting position with a hematoma to his right temporal area. She found him immediately, states that there was no seizures, no vomiting, no loss of consciousness that was obvious to her. He has been able to ambulate with minimal assistance since that time during private transport to the hospital. The patient does take a baby aspirin. He also takes multiple medications at night including Seroquel as well as 2 tablets of melatonin to help him sleep and Xanax occasionally. He has had multiple falls which always occur at night over the last 6 months 2 of which have caused cervical spine fractures. The patient has followed up with Dr. Joya Salm, who has recommended that he does no longer need to use a cervical collar for his C1 fracture.  The patient had not been having any complaints prior to this including no fever, no vomiting, no diarrhea, no cough or shortness of breath or chest pain.  Patient is a 80 y.o. male presenting with fall. The history is provided by the spouse and medical records.  Fall    Past Medical History  Diagnosis Date  . Parkinson disease   . GERD (gastroesophageal reflux disease)   . Hypertension   . Hypothyroidism     takes meds,  . Cancer     prostate  . Neuromuscular disorder     PARKINSONS  . Anxiety   . RBBB (right bundle branch block)   . CHF (congestive heart failure)   . Dyspnea on exertion   . Aortic valve disorder    Past Surgical History   Procedure Laterality Date  . Prostate surgery    . Appendectomy    . Cervical discectomy  02/18/2012     C 5 C6    . Anterior cervical decomp/discectomy fusion  02/18/2012    Procedure: ANTERIOR CERVICAL DECOMPRESSION/DISCECTOMY FUSION 1 LEVEL;  Surgeon: Floyce Stakes, MD;  Location: [facility];  Service: Neurosurgery;  Laterality: N/A;  Cervical five-six Anterior cervical decompression/diskectomy, fusion, plate  . Persantine myoview stress test  08/27/2010    Normal myocardial perfusion study. No EKG changes.  . 2d echocardiogram  09/01/2011    EF >55%, normal-mild   Family History  Problem Relation Age of Onset  . Anesthesia problems Neg Hx   . Hypotension Neg Hx   . Malignant hyperthermia Neg Hx   . Pseudochol deficiency Neg Hx   . Kidney disease Mother   . Heart disease Father   . Heart attack Brother    History  Substance Use Topics  . Smoking status: Former Smoker -- 1.00 packs/day for 25 years  . Smokeless tobacco: Current User    Types: Chew  . Alcohol Use: No    Review of Systems  Unable to perform ROS: Dementia    Allergies  Adhesive and Lasix  Home Medications   Current Outpatient Rx  Name  Route  Sig  Dispense  Refill  . ALPRAZolam (XANAX) 0.25 MG tablet  Oral   Take 0.25 mg by mouth 2 (two) times daily as needed for anxiety.         Marland Kitchen aspirin 81 MG tablet   Oral   Take 81 mg by mouth daily.          . carbidopa-levodopa (SINEMET IR) 25-100 MG per tablet   Oral   Take 1 tablet by mouth 4 (four) times daily.         Marland Kitchen ibuprofen (ADVIL,MOTRIN) 200 MG tablet   Oral   Take 200 mg by mouth 3 (three) times daily.         Marland Kitchen levothyroxine (SYNTHROID, LEVOTHROID) 25 MCG tablet   Oral   Take 25 mcg by mouth daily.         . Melatonin 10 MG CAPS   Oral   Take 10 mg by mouth at bedtime.         Marland Kitchen omeprazole (PRILOSEC) 20 MG capsule   Oral   Take 20 mg by mouth daily.         . QUEtiapine (SEROQUEL) 50 MG tablet   Oral   Take  37.5 mg by mouth at bedtime.          . tamsulosin (FLOMAX) 0.4 MG CAPS   Oral   Take 0.4 mg by mouth daily after supper.         . venlafaxine XR (EFFEXOR-XR) 150 MG 24 hr capsule   Oral   Take 150 mg by mouth daily with breakfast.         . vitamin B-12 ([employer] VITAMIN B12) 250 MCG tablet   Oral   Take 250 mcg by mouth daily.          BP 148/65  Pulse 77  Temp(Src) 97.5 F (36.4 C) (Oral)  Resp 16  SpO2 99% Physical Exam  Nursing note and vitals reviewed. Constitutional: He appears well-developed and well-nourished. No distress.  HENT:  Head: Normocephalic.  Mouth/Throat: Oropharynx is clear and moist. No oropharyngeal exudate.  Right temporal hematoma no hemotympanum, no malocclusion, no raccoon eyes, no battle sign  Eyes: Conjunctivae and EOM are normal. Pupils are equal, round, and reactive to light. Right eye exhibits no discharge. Left eye exhibits no discharge. No scleral icterus.  Neck: Normal range of motion. Neck supple. No JVD present. No thyromegaly present.  Cardiovascular: Normal rate, regular rhythm and intact distal pulses.  Exam reveals no gallop and no friction rub.   Murmur (loud systolic) heard. Pulmonary/Chest: Effort normal and breath sounds normal. No respiratory distress. He has no wheezes. He has no rales.  Abdominal: Soft. Bowel sounds are normal. He exhibits no distension and no mass. There is no tenderness.  Musculoskeletal: Normal range of motion. He exhibits no edema and no tenderness.  Supple joints, soft compartments, no trauma or deformity visualized  Lymphadenopathy:    He has no cervical adenopathy.  Neurological: He is alert. Coordination normal.  The patient has cogwheel rigidity, stable per family, follows commands without difficulty  Skin: Skin is warm and dry. No rash noted. No erythema.  Psychiatric: He has a normal mood and affect. His behavior is normal.    ED Course  Procedures (including critical care time) Labs  Review Labs Reviewed - No data to display Imaging Review Ct Head Wo Contrast  11/04/2013   CLINICAL DATA:  Fall  EXAM: CT HEAD WITHOUT CONTRAST  CT CERVICAL SPINE WITHOUT CONTRAST  TECHNIQUE: Multidetector CT imaging of the head and cervical spine was  performed following the standard protocol without intravenous contrast. Multiplanar CT image reconstructions of the cervical spine were also generated.  COMPARISON:  None.  FINDINGS: CT HEAD FINDINGS  A right frontals scalp cephalohematoma is present. No underlying skull fracture.  Diffuse atrophy with chronic microvascular ischemic disease is present. No acute intracranial hemorrhage or large vessel territory infarct. No mass lesion or midline shift. No extra-axial fluid collection. Ventricles are stable in size without evidence of hydrocephalus.  Orbits are within normal limits.  Chronic opacification of the left maxillary sinus with central calcification again seen, unchanged. Scattered mucoperiosteal thickening noted within the ethmoidal air cells. No mastoid effusion.  CT CERVICAL SPINE FINDINGS  Kyphosis of the cervical spine with apex at C6 is again seen. Sequelae of prior ACDF at C5-C6 is unchanged. There is complete ankylosis at this level. Hardware is intact. Trace retrolisthesis of C2 on C3 with partial bony ankylosis is stable. Vertebral body heights are preserved. Normal C1-2 articulations are intact. Multilevel degenerative disc disease and facet arthropathy is unchanged.  Fractures involving the anterior and posterior rings of C1 are unchanged relative to prior study without evidence of interval displacement or subluxation. No new fracture identified. No prevertebral soft tissue swelling.  Visualized soft tissues of the neck are within normal limits. Visualized lung apices are clear.  IMPRESSION: CT BRAIN:  1. Right frontal scalp contusion with no acute intracranial process identified. 2. Stable atrophy with chronic microvascular ischemic changes. 3.  Chronic left maxillary sinusitis, unchanged.  CT CERVICAL SPINE:  1. Stable appearance of fractures involving the anterior and posterior ring of C1 on the right without interval displacement or subluxation. No new fracture identified. 2. Stable kyphotic curvature of the cervical spine with sequelae of ACDF at C5-6.   Electronically Signed   By: Jeannine Boga M.D.   On: 11/04/2013 03:44   Ct Cervical Spine Wo Contrast  11/04/2013   CLINICAL DATA:  Fall  EXAM: CT HEAD WITHOUT CONTRAST  CT CERVICAL SPINE WITHOUT CONTRAST  TECHNIQUE: Multidetector CT imaging of the head and cervical spine was performed following the standard protocol without intravenous contrast. Multiplanar CT image reconstructions of the cervical spine were also generated.  COMPARISON:  None.  FINDINGS: CT HEAD FINDINGS  A right frontals scalp cephalohematoma is present. No underlying skull fracture.  Diffuse atrophy with chronic microvascular ischemic disease is present. No acute intracranial hemorrhage or large vessel territory infarct. No mass lesion or midline shift. No extra-axial fluid collection. Ventricles are stable in size without evidence of hydrocephalus.  Orbits are within normal limits.  Chronic opacification of the left maxillary sinus with central calcification again seen, unchanged. Scattered mucoperiosteal thickening noted within the ethmoidal air cells. No mastoid effusion.  CT CERVICAL SPINE FINDINGS  Kyphosis of the cervical spine with apex at C6 is again seen. Sequelae of prior ACDF at C5-C6 is unchanged. There is complete ankylosis at this level. Hardware is intact. Trace retrolisthesis of C2 on C3 with partial bony ankylosis is stable. Vertebral body heights are preserved. Normal C1-2 articulations are intact. Multilevel degenerative disc disease and facet arthropathy is unchanged.  Fractures involving the anterior and posterior rings of C1 are unchanged relative to prior study without evidence of interval displacement  or subluxation. No new fracture identified. No prevertebral soft tissue swelling.  Visualized soft tissues of the neck are within normal limits. Visualized lung apices are clear.  IMPRESSION: CT BRAIN:  1. Right frontal scalp contusion with no acute intracranial process identified. 2. Stable atrophy  with chronic microvascular ischemic changes. 3. Chronic left maxillary sinusitis, unchanged.  CT CERVICAL SPINE:  1. Stable appearance of fractures involving the anterior and posterior ring of C1 on the right without interval displacement or subluxation. No new fracture identified. 2. Stable kyphotic curvature of the cervical spine with sequelae of ACDF at C5-6.   Electronically Signed   By: Jeannine Boga M.D.   On: 11/04/2013 03:44    EKG Interpretation   None       MDM   1. Hematoma of scalp   2. Minor head injury    Other than the isolated temporal hematoma the patient has no other signs of acute trauma. I have discussed with the family at length the indications for imaging of his cervical spine and his brain. They have elected to have this procedure performed to further investigate any sources of cerebral hemorrhage or cervical fracture. They have agreed that he does have a history of a heart murmur as explained to them by their cardiologist, has been found to have aortic stenosis in the past.  Vital signs reveal that he is slightly hypertensive but no tachycardia fever or hypoxia. At this time I do not believe any other workup would be necessary as this is likely mechanical in nature predisposed by his underlying neurologic condition.  CT neg for bleed / new frx, pt stable during ED stay without decompensation - family and pt informed of results - appears stable for d/c.    Johnna Acosta, MD 11/04/13 [phone]

## 2013-11-04 NOTE — ED Notes (Signed)
Patient transported to CT 

## 2013-12-08 ENCOUNTER — Ambulatory Visit: Payer: Medicare Other | Admitting: Neurology

## 2013-12-13 ENCOUNTER — Encounter: Payer: Self-pay | Admitting: Neurology

## 2013-12-13 ENCOUNTER — Encounter (INDEPENDENT_AMBULATORY_CARE_PROVIDER_SITE_OTHER): Payer: Self-pay

## 2013-12-13 ENCOUNTER — Ambulatory Visit (INDEPENDENT_AMBULATORY_CARE_PROVIDER_SITE_OTHER): Payer: Medicare Other | Admitting: Neurology

## 2013-12-13 VITALS — BP 131/70 | HR 69 | Temp 98.9°F | Ht 68.0 in | Wt 162.0 lb

## 2013-12-13 DIAGNOSIS — R296 Repeated falls: Secondary | ICD-10-CM

## 2013-12-13 DIAGNOSIS — Z9181 History of falling: Secondary | ICD-10-CM

## 2013-12-13 DIAGNOSIS — S129XXA Fracture of neck, unspecified, initial encounter: Secondary | ICD-10-CM

## 2013-12-13 DIAGNOSIS — G4700 Insomnia, unspecified: Secondary | ICD-10-CM

## 2013-12-13 DIAGNOSIS — G20 Parkinson's disease: Secondary | ICD-10-CM

## 2013-12-13 DIAGNOSIS — R443 Hallucinations, unspecified: Secondary | ICD-10-CM

## 2013-12-13 DIAGNOSIS — R269 Unspecified abnormalities of gait and mobility: Secondary | ICD-10-CM

## 2013-12-13 DIAGNOSIS — F0280 Dementia in other diseases classified elsewhere without behavioral disturbance: Secondary | ICD-10-CM

## 2013-12-13 NOTE — Patient Instructions (Addendum)
I think your Parkinson's disease has remained fairly stable, which is reassuring. Nevertheless, as you know, this disease does progress with time. It can affect your balance, your memory, your mood, your bowel and bladder function, your posture, balance and walking. Overall you are doing fairly well but I do want to suggest a few things today:  Remember to drink plenty of fluid, eat healthy meals and do not skip any meals. Try to eat protein with a every meal and eat a healthy snack such as fruit or nuts in between meals. Try to keep a regular sleep-wake schedule and try to exercise daily, particularly in the form of walking, 10 minutes at a time.    Taking your medication on schedule is key.   Try to stay active physically and mentally. Engage in social activities in your community and with your family and try to keep up with current events by reading the newspaper or watching the news. Try to do word puzzles and you may like to do word puzzles and brain games on the computer such as on [email or web address]   As far as your medications are concerned, I would like to suggest that you take your current medication with the following additional changes: no changes today.    As far as diagnostic testing, I will order: no new test needed today.   I can see you back as needed. You can continue to see your neurologist at the [place].   Our nursing staff will answer any of your questions and relay your messages to me and also relay most of my messages to you.  Our phone number is [phone]. We also have an after hours call service for urgent matters and there is a physician on-call for urgent questions, that cannot wait till the next work day. For any emergencies you know to call 911 or go to the nearest emergency room.

## 2013-12-13 NOTE — Progress Notes (Signed)
Subjective:    Patient ID: Albert Pope is a 80 y.o. male.  HPI    Interim history:   Albert Pope is a very pleasant 80 year old right-handed gentleman with an underlying medical history of prostate cancer, depression, dementia, hypothyroidism, and arthritis, who presents for followup consultation of his advanced right-sided predominant Parkinson's disease, complicated by memory loss, orthostatic hypotension, hallucinations and dyskinesias and recurrent falls with injuries. He is accompanied by his wife again today. I last saw him on 07/26/2013, at which time I felt that his advanced age and risk for side effects limited our options for symptomatic medical treatment of his Parkinson's disease. I asked him to use his walker at all times. In the interim, he was seen in the emergency room on 08/30/2013 after a fall. Since he did not recall the mechanism of the fall he had imaging studies of his head, and cervical spine with CT scans: Stable head CT showing no acute findings. Stable appearance of right-sided C1 vertebral ring fractures without increase in displacement. In addition, reviewed the images myself through the PACS system. Pelvis x-ray was unremarkable for any acute findings. He was seen again in the emergency room on 11/04/2013 after a fall and had repeat head CT and cervical spine CT without contrast: Right frontal scalp contusion with no acute intracranial process identified. Stable atrophy with chronic microvascular ischemic changes. Chronic left maxillary sinusitis, unchanged. Stable appearance of fractures involving the anterior and posterior ring of C1 on the right without interval displacement or subluxation. No new fracture identified. Stable kyphotic curvature of the cervical spine with sequelae of ACDF at C5-6.   Today, he reports feeling stable, but she reports that he does not use his walker all the time. He tries to get his clothes on while standing. He is taking Seroquel 50 mg,  3/4 tablet each night. He had botulinum injections for drooling at the [place] recently, which has helped per wife.   I first met him on 04/08/2013, which time I did not make any changes to his medications but talked to them about gait safety. He had to wear a hard neck collar for about 2 months d/t his C1 Fx, and Dr. Joya Salm did not suggest any surgical intervention. He had repeat cervical spine X-ray on 05/09/13 which showed no worrisome new findings. He was using his walker at all times and indicated, he could do without it. I asked him to use his walker at all times. He has had some constipation.   He previously followed by Dr. Morene Antu and was last seen by him on 12/07/2012, at which time Dr. Erling Cruz felt that he had significant underlying depression and was having delusions secondary to depression or dementia. He was referred to psychiatry at the time. He had significant sialorrhea for which he was going to get Botox injections under Dr. Nicki Reaper at [facility].  He has an 11+ year history of idiopathic Parkinson's disease, right-sided predominant, complicated with dyskinesias and hallucinations and memory loss. He had significant nausea on Requip. He has had some delusions regarding losing his house. He developed vertigo in May 2011. He had a CT head at the time which was normal. MRI brain without contrast in May 2011 showed changes of chronic microvascular ischemia and generalized atrophy, MRA head showed no significant large vessel stenosis. He echocardiogram and stress test for workup of shortness of breath which were normal. He fell in May 2013 and hit his head. CT head showed chronic atrophy and small vessel  changes. C-spine x-ray showed degenerative changes without fracture. MRI C-spine showed C5-6 herniated disc and the patient was admitted for surgery in 5/13. In March 2014 his MMSE was 19, clock drawing was two out of four, animal fluency was 4.  He has fallen several times and fell fractured C1 and has seen Dr.  Joya Salm, who prescribed a hard collar, which he wore for at least 8 weeks. He saw Dr. Casimiro Needle in psychiatry. He mostly falls when getting out of bed. He has multiple canes and 2 walkers. They have 2 aids, one comes 6h/week and the other comes 7h/week.  His C spine CT from 03/14/13 showed: Unchanged right C1 anterior and posterior arch fractures. No interval healing. Unchanged mild lateral displacement C1 lateral mass. He has an appt with Dr. Joya Salm on 05/09/13.  CTH was in May 2014 showed: Posterior parietal scalp hematoma. No other acute abnormality.   Her Past Medical History Is Significant For: Past Medical History  Diagnosis Date  . Parkinson disease   . GERD (gastroesophageal reflux disease)   . Hypertension   . Hypothyroidism     takes meds,  . Cancer     prostate  . Neuromuscular disorder     PARKINSONS  . Anxiety   . RBBB (right bundle branch block)   . CHF (congestive heart failure)   . Dyspnea on exertion   . Aortic valve disorder     Her Past Surgical History Is Significant For: Past Surgical History  Procedure Laterality Date  . Prostate surgery    . Appendectomy    . Cervical discectomy  02/18/2012     C 5 C6    . Anterior cervical decomp/discectomy fusion  02/18/2012    Procedure: ANTERIOR CERVICAL DECOMPRESSION/DISCECTOMY FUSION 1 LEVEL;  Surgeon: Floyce Stakes, MD;  Location: [facility];  Service: Neurosurgery;  Laterality: N/A;  Cervical five-six Anterior cervical decompression/diskectomy, fusion, plate  . Persantine myoview stress test  08/27/2010    Normal myocardial perfusion study. No EKG changes.  . 2d echocardiogram  09/01/2011    EF >55%, normal-mild    Her Family History Is Significant For: Family History  Problem Relation Age of Onset  . Anesthesia problems Neg Hx   . Hypotension Neg Hx   . Malignant hyperthermia Neg Hx   . Pseudochol deficiency Neg Hx   . Kidney disease Mother   . Heart disease Father   . Heart attack Brother     Her Social  History Is Significant For: History   Social History  . Marital Status: Married    Spouse Name: [name]    Number of Children: 1  . Years of Education: HS   Social History Main Topics  . Smoking status: Former Smoker -- 1.00 packs/day for 25 years  . Smokeless tobacco: Current User    Types: Chew  . Alcohol Use: No  . Drug Use: No  . Sexual Activity: Not Currently   Other Topics Concern  . None   Social History Narrative   Pt lives at home with spouse.   Caffeine Use: none    Her Allergies Are:  Allergies  Allergen Reactions  . Adhesive [Tape]   . Lasix [Furosemide] Rash  :   Her Current Medications Are:  Outpatient Encounter Prescriptions as of 12/13/2013  Medication Sig  . ALPRAZolam (XANAX) 0.25 MG tablet Take 0.25 mg by mouth 2 (two) times daily as needed for anxiety.  Marland Kitchen aspirin 81 MG tablet Take 81 mg by mouth daily.   Marland Kitchen  carbidopa-levodopa (SINEMET IR) 25-100 MG per tablet Take 1 tablet by mouth 4 (four) times daily.  Marland Kitchen ibuprofen (ADVIL,MOTRIN) 200 MG tablet Take 200 mg by mouth 3 (three) times daily.  Marland Kitchen levothyroxine (SYNTHROID, LEVOTHROID) 25 MCG tablet Take 25 mcg by mouth daily.  . Melatonin 10 MG CAPS Take 10 mg by mouth at bedtime.  Marland Kitchen omeprazole (PRILOSEC) 20 MG capsule Take 20 mg by mouth daily.  . QUEtiapine (SEROQUEL) 50 MG tablet Take 37.5 mg by mouth at bedtime.   . tamsulosin (FLOMAX) 0.4 MG CAPS Take 0.4 mg by mouth daily after supper.  . venlafaxine XR (EFFEXOR-XR) 150 MG 24 hr capsule Take 150 mg by mouth daily with breakfast.  . vitamin B-12 ([employer] VITAMIN B12) 250 MCG tablet Take 250 mcg by mouth daily.   Review of Systems:  Out of a complete 14 point review of systems, all are reviewed and negative with the exception of these symptoms as listed below:  Review of Systems  Constitutional: Positive for activity change, appetite change and fatigue.  HENT: Positive for drooling, hearing loss, rhinorrhea and trouble swallowing.   Eyes: Positive for  visual disturbance.  Respiratory: Negative.   Cardiovascular:       Murmur  Gastrointestinal: Negative.   Endocrine: Negative.   Genitourinary: Positive for difficulty urinating.  Musculoskeletal: Positive for gait problem, neck pain and neck stiffness.  Skin: Negative.   Allergic/Immunologic: Negative.   Neurological: Positive for tremors, facial asymmetry, speech difficulty and weakness.       Memory loss  Hematological: Negative.   Psychiatric/Behavioral: Positive for hallucinations, behavioral problems, confusion and dysphoric mood. The patient is nervous/anxious.    Objective:  Neurologic Exam  Physical Exam Physical Examination:   Filed Vitals:   12/13/13 1545  BP: 131/70  Pulse: 69  Temp: 98.9 F (37.2 C)   General Examination: The patient is a very pleasant 80 y.o. male in no acute distress.  HEENT: Normocephalic, atraumatic, pupils are equal, round and reactive to light and accommodation. Extraocular tracking shows mild saccadic breakdown without nystagmus noted. There is limitation to upper gaze. There is mild decrease in eye blink rate. Hearing is markedly impaired with hearing aids in place b/l. He has multiple scars on his scalp from [facility] removal recently. Face is symmetric with mild facial masking and normal facial sensation. There is no lip, neck or jaw tremor. Neck is not moved passively by me today d/t prior neck Fx. There are no carotid bruits on auscultation. Oropharynx exam reveals mild mouth dryness. No significant airway crowding is noted. Mallampati is class II. Tongue protrudes centrally and palate elevates symmetrically. There is mild nasal discharge.   Chest: is clear to auscultation without wheezing, rhonchi or crackles noted.  Heart: sounds are regular and normal without murmurs, rubs or gallops noted.   Abdomen: is soft, non-tender and non-distended with normal bowel sounds appreciated on auscultation.  Extremities: There is no pitting edema in the  distal lower extremities bilaterally. There are no varicose veins.  Skin: is warm and dry with no trophic changes noted. Age-related changes are noted on the skin.   Musculoskeletal: exam reveals no obvious joint deformities, tenderness, joint swelling or erythema.  Neurologically:  Mental status: The patient is awake and alert, paying fair attention. He is unable to provide the history. His wife provides the entire Hx. He is oriented to: person, place, situation, day of week, month of year and year. His memory, attention, language and knowledge are impaired. There is no  aphasia, agnosia, apraxia or anomia. There is a mild degree of bradyphrenia. Speech is moderately hypophonic with moderate dysarthria noted. Mood is congruent and affect is normal.   Cranial nerves are as described above under HEENT exam. In addition, shoulder shrug is normal with equal shoulder height noted.  Motor exam: Normal bulk, and strength for age is noted. There are no dyskinesias noted.  Tone is mildly rigid with absence of cogwheeling. There is overall mild bradykinesia. There is no drift or rebound.  There is an intermittent RUE resting tremor and a slight LUE resting tremor. Reflexes are 1+ in the upper extremities and trace in the lower extremities. Fine motor skills exam: Finger taps are moderately impaired on the right and moderately impaired on the left. Hand movements are moderately impaired on the right and moderately impaired on the left. RAP (rapid alternating patting) is moderately impaired on the right and left. Foot taps are moderately impaired on the right and mildly impaired on the left. Foot agility (in the form of heel stomping) is severely impaired on the right and moderately impaired on the left.    Cerebellar testing shows no dysmetria or intention tremor on finger to nose testing. There is no truncal or gait ataxia.   Sensory exam is intact to light touch.   Gait, station and balance: He stands up  from the seated position with mild difficulty and does need to push up with His hands. He needs little assistance. No veering to one side is noted. He is not noted to lean to the side. Posture is moderately stooped with increase in cervical kyphosis and he looks down with walking. Stance is wide-based. He walks with decrease in stride length and pace and decreased arm swing. He turns in 3 steps. Tandem walk is not possible for him. Balance is moderately impaired. He is not able to do a toe or heel stance. He did not bring his walker today.    Assessment and Plan:   In summary, Albert Pope is a very pleasant 80 year old male with a history of advanced Parkinson's disease, right-sided predominant, complicated by dyskinesias, hallucinations, poor medication tolerance, gait disorder, balance problems, recurrent falls with recent injury to his neck, and overall frailty due advanced age. I again had a very long chat with the patient and his wife regarding advanced Parkinson's disease, its prognosis and treatment options. We are somewhat limited in our options because of his advanced age and risk for side effects with medications. I explained to him again that he is at significant risk for falling and needs to use his walker at all times. He is advised not to get out of bed by himself at any point and sit down to get dressed. He is also advised to stand first for several seconds to get his balance and bearings first before starting to walk. I did not make any changes to his medications. He can see his neurologist at the [place] routinely and I can see him back as needed. They are having a harder time keeping all his different specialist appointments and at this juncture he may not need to see 2 different neurologists. I would not suggest increasing his Seroquel or Xanax d/t sedation and risk for worsening balance and gait. He did not require any refills and usually gets his medications through the [place]. He and his wife  were advised about fall risk and the need to use the walker even in the house and that he should not  get out of bed by himself. They understood and were in agreement.

## 2014-07-04 ENCOUNTER — Emergency Department (HOSPITAL_BASED_OUTPATIENT_CLINIC_OR_DEPARTMENT_OTHER): Payer: Medicare Other

## 2014-07-04 ENCOUNTER — Encounter (HOSPITAL_BASED_OUTPATIENT_CLINIC_OR_DEPARTMENT_OTHER): Payer: Self-pay | Admitting: Emergency Medicine

## 2014-07-04 ENCOUNTER — Emergency Department (HOSPITAL_BASED_OUTPATIENT_CLINIC_OR_DEPARTMENT_OTHER)
Admission: EM | Admit: 2014-07-04 | Discharge: 2014-07-04 | Disposition: A | Discharge status: Home / Self Care | Payer: Medicare Other | Attending: Emergency Medicine | Admitting: Emergency Medicine

## 2014-07-04 DIAGNOSIS — Z8719 Personal history of other diseases of the digestive system: Secondary | ICD-10-CM | POA: Diagnosis not present

## 2014-07-04 DIAGNOSIS — Z79899 Other long term (current) drug therapy: Secondary | ICD-10-CM | POA: Diagnosis not present

## 2014-07-04 DIAGNOSIS — W19XXXA Unspecified fall, initial encounter: Secondary | ICD-10-CM

## 2014-07-04 DIAGNOSIS — S0990XA Unspecified injury of head, initial encounter: Secondary | ICD-10-CM

## 2014-07-04 DIAGNOSIS — Z7982 Long term (current) use of aspirin: Secondary | ICD-10-CM | POA: Insufficient documentation

## 2014-07-04 DIAGNOSIS — E039 Hypothyroidism, unspecified: Secondary | ICD-10-CM | POA: Insufficient documentation

## 2014-07-04 DIAGNOSIS — Z791 Long term (current) use of non-steroidal anti-inflammatories (NSAID): Secondary | ICD-10-CM | POA: Insufficient documentation

## 2014-07-04 DIAGNOSIS — F419 Anxiety disorder, unspecified: Secondary | ICD-10-CM | POA: Insufficient documentation

## 2014-07-04 DIAGNOSIS — I509 Heart failure, unspecified: Secondary | ICD-10-CM | POA: Diagnosis not present

## 2014-07-04 DIAGNOSIS — Z87891 Personal history of nicotine dependence: Secondary | ICD-10-CM | POA: Insufficient documentation

## 2014-07-04 DIAGNOSIS — S0003XA Contusion of scalp, initial encounter: Secondary | ICD-10-CM | POA: Diagnosis not present

## 2014-07-04 DIAGNOSIS — Y9389 Activity, other specified: Secondary | ICD-10-CM | POA: Diagnosis not present

## 2014-07-04 DIAGNOSIS — W1830XA Fall on same level, unspecified, initial encounter: Secondary | ICD-10-CM | POA: Diagnosis not present

## 2014-07-04 DIAGNOSIS — G20 Parkinson's disease: Secondary | ICD-10-CM | POA: Diagnosis not present

## 2014-07-04 DIAGNOSIS — I10 Essential (primary) hypertension: Secondary | ICD-10-CM | POA: Insufficient documentation

## 2014-07-04 DIAGNOSIS — Y929 Unspecified place or not applicable: Secondary | ICD-10-CM | POA: Insufficient documentation

## 2014-07-04 DIAGNOSIS — S0191XA Laceration without foreign body of unspecified part of head, initial encounter: Secondary | ICD-10-CM | POA: Diagnosis present

## 2014-07-04 NOTE — ED Notes (Signed)
Patient transported to CT 

## 2014-07-04 NOTE — Discharge Instructions (Signed)

## 2014-07-04 NOTE — ED Notes (Signed)
Pt fall from standing x 3 hrs ago laceration to posterior head .

## 2014-07-04 NOTE — ED Provider Notes (Signed)
CSN: [id number]     Arrival date & time 07/04/14  2004 History  This chart was scribed for Ernestina Patches, MD by Peyton Bottoms, ED Scribe. This patient was seen in room MH09/MH09 and the patient's care was started at 10:28 PM.   Chief Complaint  Patient presents with  . Head Laceration   Patient is a 80 y.o. male presenting with scalp laceration and neck injury. The history is provided by the patient, the spouse and a relative. No language interpreter was used.  Head Laceration This is a new problem. The current episode started 3 to 5 hours ago. Pertinent negatives include no chest pain, no abdominal pain, no headaches and no shortness of breath. Nothing aggravates the symptoms. Nothing relieves the symptoms. He has tried a cold compress for the symptoms.  Neck Injury This is a new problem. The current episode started 3 to 5 hours ago. The problem occurs constantly. The problem has not changed since onset.Pertinent negatives include no chest pain, no abdominal pain, no headaches and no shortness of breath.   HPI Comments: Albert Pope is a 80 y.o. male with a history of Parkinson disease, who presents to the Emergency Department complaining of a laceration to his posterior head that occurred 3 hours ago when patient fell from standing. Patient's wife found his fallen on the ground laying in fetal position after 2 minutes. Patient had a previous fall in May 2015. Per family, patient denies associated weakness, emesis, bladder incontinence. Patient takes 1-a-day aspirin daily.   Past Medical History  Diagnosis Date  . Parkinson disease   . GERD (gastroesophageal reflux disease)   . Hypertension   . Hypothyroidism     takes meds,  . Cancer     prostate  . Neuromuscular disorder     PARKINSONS  . Anxiety   . RBBB (right bundle branch block)   . CHF (congestive heart failure)   . Dyspnea on exertion   . Aortic valve disorder    Past Surgical History  Procedure Laterality Date  .  Prostate surgery    . Appendectomy    . Cervical discectomy  02/18/2012     C 5 C6    . Anterior cervical decomp/discectomy fusion  02/18/2012    Procedure: ANTERIOR CERVICAL DECOMPRESSION/DISCECTOMY FUSION 1 LEVEL;  Surgeon: Floyce Stakes, MD;  Location: [facility];  Service: Neurosurgery;  Laterality: N/A;  Cervical five-six Anterior cervical decompression/diskectomy, fusion, plate  . Persantine myoview stress test  08/27/2010    Normal myocardial perfusion study. No EKG changes.  . 2d echocardiogram  09/01/2011    EF >55%, normal-mild   Family History  Problem Relation Age of Onset  . Anesthesia problems Neg Hx   . Hypotension Neg Hx   . Malignant hyperthermia Neg Hx   . Pseudochol deficiency Neg Hx   . Kidney disease Mother   . Heart disease Father   . Heart attack Brother    History  Substance Use Topics  . Smoking status: Former Smoker -- 1.00 packs/day for 25 years  . Smokeless tobacco: Current User    Types: Chew  . Alcohol Use: No   Review of Systems  Constitutional: Negative for fever, activity change, appetite change and fatigue.  HENT: Negative for congestion, facial swelling, rhinorrhea and trouble swallowing.   Eyes: Negative for photophobia and pain.  Respiratory: Negative for cough, chest tightness and shortness of breath.   Cardiovascular: Negative for chest pain and leg swelling.  Gastrointestinal: Negative for  nausea, vomiting, abdominal pain, diarrhea and constipation.  Endocrine: Negative for polydipsia and polyuria.  Genitourinary: Negative for dysuria, urgency, decreased urine volume and difficulty urinating.  Musculoskeletal: Negative for back pain and gait problem.  Skin: Positive for wound. Negative for color change and rash.  Allergic/Immunologic: Negative for immunocompromised state.  Neurological: Negative for dizziness, facial asymmetry, speech difficulty, weakness, numbness and headaches.  Psychiatric/Behavioral: Negative for confusion,  decreased concentration and agitation.   Allergies  Adhesive and Lasix  Home Medications   Prior to Admission medications   Medication Sig Start Date End Date Taking? Authorizing Provider  ALPRAZolam (XANAX) 0.25 MG tablet Take 0.25 mg by mouth 2 (two) times daily as needed for anxiety.    Historical Provider, MD  aspirin 81 MG tablet Take 81 mg by mouth daily.     Historical Provider, MD  carbidopa-levodopa (SINEMET IR) 25-100 MG per tablet Take 1 tablet by mouth 4 (four) times daily.    Historical Provider, MD  ibuprofen (ADVIL,MOTRIN) 200 MG tablet Take 200 mg by mouth 3 (three) times daily.    Historical Provider, MD  levothyroxine (SYNTHROID, LEVOTHROID) 25 MCG tablet Take 25 mcg by mouth daily.    Historical Provider, MD  Melatonin 10 MG CAPS Take 10 mg by mouth at bedtime.    Historical Provider, MD  QUEtiapine (SEROQUEL) 50 MG tablet Take 37.5 mg by mouth at bedtime.     Historical Provider, MD  tamsulosin (FLOMAX) 0.4 MG CAPS Take 0.4 mg by mouth daily after supper.    Historical Provider, MD  venlafaxine XR (EFFEXOR-XR) 150 MG 24 hr capsule Take 150 mg by mouth daily with breakfast.    Historical Provider, MD  vitamin B-12 ([employer] VITAMIN B12) 250 MCG tablet Take 250 mcg by mouth daily.    Historical Provider, MD   Triage Vitals: BP 135/69  Pulse 70  Temp(Src) 98.1 F (36.7 C) (Oral)  Resp 16  Ht 5\' 9"  (1.753 m)  Wt 150 lb (68.04 kg)  BMI 22.14 kg/m2  SpO2 100%  Physical Exam  Constitutional: He is oriented to person, place, and time. He appears well-developed and well-nourished. No distress.  HENT:  Head: Normocephalic and atraumatic.  Mouth/Throat: No oropharyngeal exudate.  Eyes: Pupils are equal, round, and reactive to light.  Neck: Normal range of motion. Neck supple.  Cardiovascular: Normal rate, regular rhythm and normal heart sounds.  Exam reveals no gallop and no friction rub.   No murmur heard. Pulmonary/Chest: Effort normal and breath sounds normal. No  respiratory distress. He has no wheezes. He has no rales.  Abdominal: Soft. Bowel sounds are normal. He exhibits no distension and no mass. There is no tenderness. There is no rebound and no guarding.  Musculoskeletal: Normal range of motion. He exhibits no edema and no tenderness.  Neurological: He is alert and oriented to person, place, and time.  Skin: Skin is warm and dry.  Psychiatric: He has a normal mood and affect.   ED Course  Procedures (including critical care time)  DIAGNOSTIC STUDIES: Oxygen Saturation is 100% on RA, normal by my interpretation.    COORDINATION OF CARE: 10:45 PM- Discussed plans to order diagnostic imaging. Pt advised of plan for treatment and pt agrees.  Labs Review Labs Reviewed - No data to display  Imaging Review Ct Head Wo Contrast  07/04/2014   CLINICAL DATA:  Posterior neck pain. Laceration to the posterior aspect of the head secondary to a fall today.  EXAM: CT HEAD WITHOUT CONTRAST  CT CERVICAL SPINE WITHOUT CONTRAST  TECHNIQUE: Multidetector CT imaging of the head and cervical spine was performed following the standard protocol without intravenous contrast. Multiplanar CT image reconstructions of the cervical spine were also generated.  COMPARISON:  None.  CT scan dated 11/04/2013  FINDINGS: CT HEAD FINDINGS  No mass lesion. No midline shift. No acute intracranial hemorrhage or intracranial hematoma. No extra-axial fluid collections. No evidence of acute infarction. There is diffuse cerebral cortical and cerebellar atrophy, stable. No acute osseous abnormality. Small scalp hematoma high over the left parietal bone.  CT CERVICAL SPINE FINDINGS  There are old fractures of the anterior posterior arches of the right side of C1, unchanged. There is no acute fracture or subluxation. There is severe degenerative changes between the anterior arch of C1 and the odontoid process. There is no old fusion of C5-6. There is auto fusion at C3-4 and at C6-7. There is  reversal of the cervical lordosis. The patient has had posterior decompression at C5-6.  There is multilevel degenerative disc and joint disease. No prevertebral soft tissue swelling.  IMPRESSION: 1. No acute intracranial abnormality. Small left parietal scalp hematoma. 2. No acute abnormality of the cervical spine. Old C1 fracture. Auto fusion at C3-4 and at C6-7. Surgical fusion at C5-6 with posterior decompression. Chronic reversal of the cervical lordosis.   Electronically Signed   By: Rozetta Nunnery M.D.   On: 07/04/2014 22:39   Ct Cervical Spine Wo Contrast  07/04/2014   CLINICAL DATA:  Posterior neck pain. Laceration to the posterior aspect of the head secondary to a fall today.  EXAM: CT HEAD WITHOUT CONTRAST  CT CERVICAL SPINE WITHOUT CONTRAST  TECHNIQUE: Multidetector CT imaging of the head and cervical spine was performed following the standard protocol without intravenous contrast. Multiplanar CT image reconstructions of the cervical spine were also generated.  COMPARISON:  None.  CT scan dated 11/04/2013  FINDINGS: CT HEAD FINDINGS  No mass lesion. No midline shift. No acute intracranial hemorrhage or intracranial hematoma. No extra-axial fluid collections. No evidence of acute infarction. There is diffuse cerebral cortical and cerebellar atrophy, stable. No acute osseous abnormality. Small scalp hematoma high over the left parietal bone.  CT CERVICAL SPINE FINDINGS  There are old fractures of the anterior posterior arches of the right side of C1, unchanged. There is no acute fracture or subluxation. There is severe degenerative changes between the anterior arch of C1 and the odontoid process. There is no old fusion of C5-6. There is auto fusion at C3-4 and at C6-7. There is reversal of the cervical lordosis. The patient has had posterior decompression at C5-6.  There is multilevel degenerative disc and joint disease. No prevertebral soft tissue swelling.  IMPRESSION: 1. No acute intracranial  abnormality. Small left parietal scalp hematoma. 2. No acute abnormality of the cervical spine. Old C1 fracture. Auto fusion at C3-4 and at C6-7. Surgical fusion at C5-6 with posterior decompression. Chronic reversal of the cervical lordosis.   Electronically Signed   By: Rozetta Nunnery M.D.   On: 07/04/2014 22:39     EKG Interpretation None     MDM   Final diagnoses:  Fall from standing, initial encounter  Closed head injury without loss of consciousness, initial encounter  Scalp contusion, initial encounter    Pt is a 80 y.o. male with Pmhx as above who presents with posterior scalp contusion/abrasion after mechanical fall OO transfer chair. He is at baseline mental status. No focal neuro findings on PE. CT  head & c-spine w/o acute findings. Will d/c home. Return precautions given for new or worsening symptoms including AMS.        I personally performed the services described in this documentation, which was scribed in my presence. The recorded information has been reviewed and is accurate.  Ernestina Patches, MD 07/05/14 1215

## 2014-11-15 ENCOUNTER — Emergency Department (HOSPITAL_COMMUNITY)
Admission: EM | Admit: 2014-11-15 | Discharge: 2014-11-15 | Disposition: A | Discharge status: Home / Self Care | Payer: Medicare Other | Attending: Emergency Medicine | Admitting: Emergency Medicine

## 2014-11-15 ENCOUNTER — Encounter (HOSPITAL_COMMUNITY): Payer: Self-pay | Admitting: Emergency Medicine

## 2014-11-15 DIAGNOSIS — Z7982 Long term (current) use of aspirin: Secondary | ICD-10-CM | POA: Diagnosis not present

## 2014-11-15 DIAGNOSIS — R1030 Lower abdominal pain, unspecified: Secondary | ICD-10-CM | POA: Diagnosis not present

## 2014-11-15 DIAGNOSIS — F419 Anxiety disorder, unspecified: Secondary | ICD-10-CM | POA: Diagnosis not present

## 2014-11-15 DIAGNOSIS — I10 Essential (primary) hypertension: Secondary | ICD-10-CM | POA: Diagnosis not present

## 2014-11-15 DIAGNOSIS — Z9049 Acquired absence of other specified parts of digestive tract: Secondary | ICD-10-CM | POA: Insufficient documentation

## 2014-11-15 DIAGNOSIS — R5383 Other fatigue: Secondary | ICD-10-CM

## 2014-11-15 DIAGNOSIS — E039 Hypothyroidism, unspecified: Secondary | ICD-10-CM | POA: Insufficient documentation

## 2014-11-15 DIAGNOSIS — Z87891 Personal history of nicotine dependence: Secondary | ICD-10-CM | POA: Diagnosis not present

## 2014-11-15 DIAGNOSIS — I509 Heart failure, unspecified: Secondary | ICD-10-CM | POA: Insufficient documentation

## 2014-11-15 DIAGNOSIS — G20 Parkinson's disease: Secondary | ICD-10-CM | POA: Insufficient documentation

## 2014-11-15 DIAGNOSIS — R4182 Altered mental status, unspecified: Secondary | ICD-10-CM | POA: Diagnosis present

## 2014-11-15 DIAGNOSIS — Z8719 Personal history of other diseases of the digestive system: Secondary | ICD-10-CM | POA: Insufficient documentation

## 2014-11-15 DIAGNOSIS — Z79899 Other long term (current) drug therapy: Secondary | ICD-10-CM | POA: Diagnosis not present

## 2014-11-15 DIAGNOSIS — Z8546 Personal history of malignant neoplasm of prostate: Secondary | ICD-10-CM | POA: Insufficient documentation

## 2014-11-15 LAB — CBC
HEMATOCRIT: 46 % (ref 39.0–52.0)
HEMOGLOBIN: 16 g/dL (ref 13.0–17.0)
MCH: 33.2 pg (ref 26.0–34.0)
MCHC: 34.8 g/dL (ref 30.0–36.0)
MCV: 95.4 fL (ref 78.0–100.0)
Platelets: 218 10*3/uL (ref 150–400)
RBC: 4.82 MIL/uL (ref 4.22–5.81)
RDW: 13.4 % (ref 11.5–15.5)
WBC: 9.7 10*3/uL (ref 4.0–10.5)

## 2014-11-15 LAB — URINALYSIS, ROUTINE W REFLEX MICROSCOPIC
Glucose, UA: NEGATIVE mg/dL
Hgb urine dipstick: NEGATIVE
Ketones, ur: NEGATIVE mg/dL
LEUKOCYTES UA: NEGATIVE
Nitrite: NEGATIVE
PROTEIN: NEGATIVE mg/dL
SPECIFIC GRAVITY, URINE: 1.024 (ref 1.005–1.030)
UROBILINOGEN UA: 4 mg/dL — AB (ref 0.0–1.0)
pH: 6.5 (ref 5.0–8.0)

## 2014-11-15 LAB — COMPREHENSIVE METABOLIC PANEL
ALT: 10 U/L (ref 0–53)
AST: 21 U/L (ref 0–37)
Albumin: 3.9 g/dL (ref 3.5–5.2)
Alkaline Phosphatase: 54 U/L (ref 39–117)
Anion gap: 9 (ref 5–15)
BILIRUBIN TOTAL: 1.2 mg/dL (ref 0.3–1.2)
BUN: 17 mg/dL (ref 6–23)
CHLORIDE: 109 mmol/L (ref 96–112)
CO2: 26 mmol/L (ref 19–32)
CREATININE: 1.08 mg/dL (ref 0.50–1.35)
Calcium: 8.6 mg/dL (ref 8.4–10.5)
GFR calc non Af Amer: 60 mL/min — ABNORMAL LOW (ref >90)
GFR, EST AFRICAN AMERICAN: 69 mL/min — AB (ref >90)
GLUCOSE: 109 mg/dL — AB (ref 70–99)
Potassium: 4.3 mmol/L (ref 3.5–5.1)
Sodium: 144 mmol/L (ref 135–145)
Total Protein: 6.8 g/dL (ref 6.0–8.3)

## 2014-11-15 LAB — CBG MONITORING, ED: GLUCOSE-CAPILLARY: 99 mg/dL (ref 70–99)

## 2014-11-15 MED ORDER — SODIUM CHLORIDE 0.9 % IV SOLN
INTRAVENOUS | Status: DC
  Administered 2014-11-15: 20:00:00 via INTRAVENOUS

## 2014-11-15 MED ORDER — LORAZEPAM 0.5 MG PO TABS: 0.5000 mg | ORAL_TABLET | Freq: Three times a day (TID) | ORAL | Status: AC | PRN

## 2014-11-15 MED ORDER — SODIUM CHLORIDE 0.9 % IV BOLUS (SEPSIS)
500.0000 mL | Freq: Once | INTRAVENOUS | Status: AC
  Administered 2014-11-15: 500 mL via INTRAVENOUS

## 2014-11-15 NOTE — Discharge Instructions (Signed)
°  Stop taking the Valium, and Effexor. You can use Ativan( lorazepam) is needed for anxiety or agitation. Try to get him to eat and drink regularly. We are asking that home health assess him for needs, and safety at home.     Weakness Weakness is a lack of strength. It may be felt all over the body (generalized) or in one specific part of the body (focal). Some causes of weakness can be serious. You may need further medical evaluation, especially if you are elderly or you have a history of immunosuppression (such as chemotherapy or HIV), kidney disease, heart disease, or diabetes. CAUSES  Weakness can be caused by many different things, including:  Infection.  Physical exhaustion.  Internal bleeding or other blood loss that results in a lack of red blood cells (anemia).  Dehydration. This cause is more common in elderly people.  Side effects or electrolyte abnormalities from medicines, such as pain medicines or sedatives.  Emotional distress, anxiety, or depression.  Circulation problems, especially severe peripheral arterial disease.  Heart disease, such as rapid atrial fibrillation, bradycardia, or heart failure.  Nervous system disorders, such as Guillain-Barr syndrome, multiple sclerosis, or stroke. DIAGNOSIS  To find the cause of your weakness, your caregiver will take your history and perform a physical exam. Lab tests or X-rays may also be ordered, if needed. TREATMENT  Treatment of weakness depends on the cause of your symptoms and can vary greatly. HOME CARE INSTRUCTIONS   Rest as needed.  Eat a well-balanced diet.  Try to get some exercise every day.  Only take over-the-counter or prescription medicines as directed by your caregiver. SEEK MEDICAL CARE IF:   Your weakness seems to be getting worse or spreads to other parts of your body.  You develop new aches or pains. SEEK IMMEDIATE MEDICAL CARE IF:   You cannot perform your normal daily activities, such  as getting dressed and feeding yourself.  You cannot walk up and down stairs, or you feel exhausted when you do so.  You have shortness of breath or chest pain.  You have difficulty moving parts of your body.  You have weakness in only one area of the body or on only one side of the body.  You have a fever.  You have trouble speaking or swallowing.  You cannot control your bladder or bowel movements.  You have black or bloody vomit or stools. MAKE SURE YOU:  Understand these instructions.  Will watch your condition.  Will get help right away if you are not doing well or get worse. Document Released: 09/15/2005 Document Revised: 03/16/2012 Document Reviewed: 11/14/2011 [facility] Patient Information 2015 [place], [place]. This information is not intended to replace advice given to you by your health care provider. Make sure you discuss any questions you have with your health care provider.

## 2014-11-15 NOTE — ED Provider Notes (Signed)
CSN: [id number]     Arrival date & time 11/15/14  1649 History   First MD Initiated Contact with Patient 11/15/14 1741     Chief Complaint  Patient presents with  . Altered Mental Status     (Consider location/radiation/quality/duration/timing/severity/associated sxs/prior Treatment) HPI  Albert Pope is a 81 y.o. male brought in by family members, by EMS, to be evaluated for weakness, lethargy, decreased oral intake and decreased urinary output.  He is followed at the [place] in [place].  His wife has contacted his provider to ask about increasing in-home health services.  He currently has sitters several days each week.  His wife has to feed him and give him fluids, and he is taking less of each in the last several days.  He is taking his usual medications as prescribed.  About 2 months ago.  His Ativan was changed to Valium, because of sleeplessness.  Since that time he has slept better, but been more sedated and sleeping during the day as well.  There have been no other medication changes, recently. Patient cannot contribute to history.  Level V Caveat- dementia       Past Medical History  Diagnosis Date  . Parkinson disease   . GERD (gastroesophageal reflux disease)   . Hypertension   . Hypothyroidism     takes meds,  . Cancer     prostate  . Neuromuscular disorder     PARKINSONS  . Anxiety   . RBBB (right bundle branch block)   . CHF (congestive heart failure)   . Dyspnea on exertion   . Aortic valve disorder    Past Surgical History  Procedure Laterality Date  . Prostate surgery    . Appendectomy    . Cervical discectomy  02/18/2012     C 5 C6    . Anterior cervical decomp/discectomy fusion  02/18/2012    Procedure: ANTERIOR CERVICAL DECOMPRESSION/DISCECTOMY FUSION 1 LEVEL;  Surgeon: Floyce Stakes, MD;  Location: [facility];  Service: Neurosurgery;  Laterality: N/A;  Cervical five-six Anterior cervical decompression/diskectomy, fusion, plate  . Persantine myoview  stress test  08/27/2010    Normal myocardial perfusion study. No EKG changes.  . 2d echocardiogram  09/01/2011    EF >55%, normal-mild   Family History  Problem Relation Age of Onset  . Anesthesia problems Neg Hx   . Hypotension Neg Hx   . Malignant hyperthermia Neg Hx   . Pseudochol deficiency Neg Hx   . Kidney disease Mother   . Heart disease Father   . Heart attack Brother    History  Substance Use Topics  . Smoking status: Former Smoker -- 1.00 packs/day for 25 years  . Smokeless tobacco: Current User    Types: Chew  . Alcohol Use: No    Review of Systems  Unable to perform ROS     Allergies  Adhesive and Lasix  Home Medications   Prior to Admission medications   Medication Sig Start Date End Date Taking? Authorizing Provider  aspirin 81 MG tablet Take 81 mg by mouth daily.    Yes Historical Provider, MD  carbidopa-levodopa (SINEMET IR) 25-100 MG per tablet Take 1 tablet by mouth 4 (four) times daily.   Yes Historical Provider, MD  ibuprofen (ADVIL,MOTRIN) 200 MG tablet Take 200 mg by mouth 3 (three) times daily.   Yes Historical Provider, MD  levothyroxine (SYNTHROID, LEVOTHROID) 25 MCG tablet Take 25 mcg by mouth daily.   Yes Historical Provider, MD  Melatonin 10 MG CAPS Take 10 mg by mouth at bedtime.   Yes Historical Provider, MD  QUEtiapine (SEROQUEL) 50 MG tablet Take 100 mg by mouth at bedtime.    Yes Historical Provider, MD  tamsulosin (FLOMAX) 0.4 MG CAPS Take 0.4 mg by mouth daily after supper.   Yes Historical Provider, MD  tobramycin-dexamethasone [facility]) ophthalmic solution Place 1 drop into both eyes 2 (two) times daily as needed (dry eyes).   Yes Historical Provider, MD  vitamin B-12 ([employer] VITAMIN B12) 250 MCG tablet Take 250 mcg by mouth daily.   Yes Historical Provider, MD  LORazepam (ATIVAN) 0.5 MG tablet Take 1 tablet (0.5 mg total) by mouth every 8 (eight) hours as needed for anxiety. 11/15/14   [name], MD   BP 147/73 mmHg  Pulse 94   Temp(Src) 97.5 F (36.4 C) (Oral)  Resp 18  SpO2 97% Physical Exam  Constitutional: He appears well-developed.  Elderly, frail  HENT:  Head: Normocephalic and atraumatic.  Right Ear: External ear normal.  Left Ear: External ear normal.  Dry mucous membranes, without oral lesions.  Eyes: Conjunctivae and EOM are normal. Pupils are equal, round, and reactive to light. Right eye exhibits discharge (mild, opaque). Left eye exhibits discharge (Mild, opaque).  Neck: Normal range of motion and phonation normal. Neck supple.  Cardiovascular: Normal rate, regular rhythm and normal heart sounds.   Pulmonary/Chest: Effort normal and breath sounds normal. He exhibits no bony tenderness.  Abdominal: Soft. There is tenderness (suprapubic, with guarding, and questionable mass.).  Musculoskeletal: Normal range of motion.  Neurological: He is alert. No cranial nerve deficit or sensory deficit. Coordination normal.  Bilateral cogwheel rigidity.  No asymmetry. He does not follow commands, oriented to questions.  Skin: Skin is warm, dry and intact.  Psychiatric:  Lethargic  Nursing note and vitals reviewed.   ED Course  Procedures (including critical care time)  Ordered  foley catheter placement to assess for urine volume, as he has suspected urinary bladder enlargement  Medications  0.9 %  sodium chloride infusion ( Intravenous New Bag/Given 11/15/14 1945)  sodium chloride 0.9 % bolus 500 mL (0 mLs Intravenous Stopped 11/15/14 1941)    Patient Vitals for the past 24 hrs:  BP Temp Temp src Pulse Resp SpO2  11/15/14 1946 147/73 mmHg - - 94 18 97 %  11/15/14 1657 136/93 mmHg 97.5 F (36.4 C) Oral 79 16 93 %    8:41 PM Reevaluation with update and discussion. After initial assessment and treatment, an updated evaluation reveals he has been able to tolerate about 8 ounces of water orally.  Foley catheter placed, 200 cc of urine was obtained, slowly.  Findings discussed with patient's family, all  questions answered.  Home health assessment ordered, by me. Taylore Hinde L    Labs Review Labs Reviewed  COMPREHENSIVE METABOLIC PANEL - Abnormal; Notable for the following:    Glucose, Bld 109 (*)    GFR calc non Af Amer 60 (*)    GFR calc Af Amer 69 (*)    All other components within normal limits  URINALYSIS, ROUTINE W REFLEX MICROSCOPIC - Abnormal; Notable for the following:    Color, Urine AMBER (*)    Bilirubin Urine SMALL (*)    Urobilinogen, UA 4.0 (*)    All other components within normal limits  CBC  CBG MONITORING, ED    Imaging Review No results found.   EKG Interpretation None      MDM   Final  diagnoses:  Lethargy    Nonspecific medicine lethargy.  No apparent significant dehydration.  Possible oversedation with multiple drug therapy.  Recent starting of Valium, is likely additive.  No clear evidence that he needs to continue on Effexor at this time and it has a lot of sedating effect.  Doubt SBI, metabolic instability or impending vascular collapse.  Nursing Notes Reviewed/ Care Coordinated Applicable Imaging Reviewed Interpretation of Laboratory Data incorporated into ED treatment  The patient appears reasonably screened and/or stabilized for discharge and I doubt any other medical condition or other [facility] requiring further screening, evaluation, or treatment in the ED at this time prior to discharge.  Plan: Home Medications- stop Valium and Effexor, start Ativan when necessary; Home Treatments- rest, push oral intake; return here if the recommended treatment, does not improve the symptoms; Recommended follow up- PCP checkup one week.  In home assessment by home health service     [name], MD 11/15/14 2044

## 2014-11-15 NOTE — Progress Notes (Signed)
[facility] faxed home health orders to [facility] at 2247pm with confirmation of receipt at 2250pm.

## 2014-11-15 NOTE — Progress Notes (Signed)
  CARE MANAGEMENT ED NOTE 11/15/2014  Patient:  Albert Pope, Albert Pope   Account Number:  [id number]  Date Initiated:  11/15/2014  Documentation initiated by:  Livia Snellen  Subjective/Objective Assessment:   Patient presents to Ed with weakness, lethargy, decreased po intake and decreased urinary out put.     Subjective/Objective Assessment Detail:   Patient with pmhx of Parkinsons disease, GERD, HTN, prostate cancer, hypothyroidism, anxiety, RBBB, CHF.     Action/Plan:   Action/Plan Detail:   Anticipated DC Date:  11/15/2014     Status Recommendation to Physician:   Result of Recommendation:    Other ED Services  Consult Working [place]  CM consult  Other   [facility] Choice  HOME HEALTH   Choice offered to / List presented to:       [facility] arranged  HH-1 RN  HH-3 OT  [place]      [place] agency  [place]    Status of service:  Completed, signed off  ED Comments:   ED Comments Detail:  EDCM consulted to speak to patient and his family regarding home health services.  Patient lives at home with his wife [name].  [name] confirms patient is receiving home health services with [name] as [facility].  Patient's wife reports patient is now seeing an aide from [place] who comes to the house 3x's a week for 2 hours per day.Patient appears to be complete assist with ADL's.   Patient's wife reports she will be increasing these services and paying out of pocket for them.  Patient is also receiving services from [facility] but these services will be cancelled since services with [name] will be increased.  Patient has a transport chair and "several walkers" at home.  Patient's wife reports a visiting RN comes out once a month to check on the patient.  Patient's wife plans on getting patient hospital bed and hoyer lift through patient's pcp.  EDCM explained home health services to patient's wife.  EDCM explained patient may have a  visiting RN, PT, OT, aide and social worker if needed.  Patient's wife agreeable for [facility] to send referral to [facility] for home health services.  Home health orders with face to face plced by EDP.  Patient's family thankful for services.  No further EDCM needs at this time.

## 2014-11-15 NOTE — ED Notes (Signed)
Pt able to drink 240 ml of water and grape juice--- unable to hold cup; spouse assisted pt to drink fluids.

## 2014-11-15 NOTE — ED Notes (Signed)
Brought in by EMS from home with c/o altered mental status.  Pt is reported by wife that he has been "not himself" for the past week.  Pt has been uncooperative, but basically "appears weak, not eating".  Pt has dementia and Parkinson's, and on "diazepam".  Pt's wife is concerned if pt is "over-medicated" or "something else".

## 2014-11-15 NOTE — ED Notes (Signed)
Bed: CQ19 Expected date:  Expected time:  Means of arrival:  Comments: Ems, elderly, combative

## 2014-11-15 NOTE — ED Notes (Signed)
[facility] was called for pt's transportation back home.

## 2014-11-15 NOTE — ED Notes (Signed)
[facility] here to transport pt to home. 

## 2014-11-16 NOTE — Progress Notes (Signed)
EDCM called [facility] and spoke to [facility] who confirms home health referral has been received and will possibly begin services tomorrow.  EDCM called and spoke with patient's wife and informed her that [name] may be contacting her tomorrow.  Patient's wife Albert Pope thankful for services.  No further EDCM needs at this time.

## 2014-12-03 IMAGING — CR DG CHEST 2V
2 series · 2 of 2 positions shown · non-contrast
Comparison: 10/11/2011

CLINICAL DATA: Trauma secondary to a fall.

CHEST - 2 VIEW

[w chest lat]
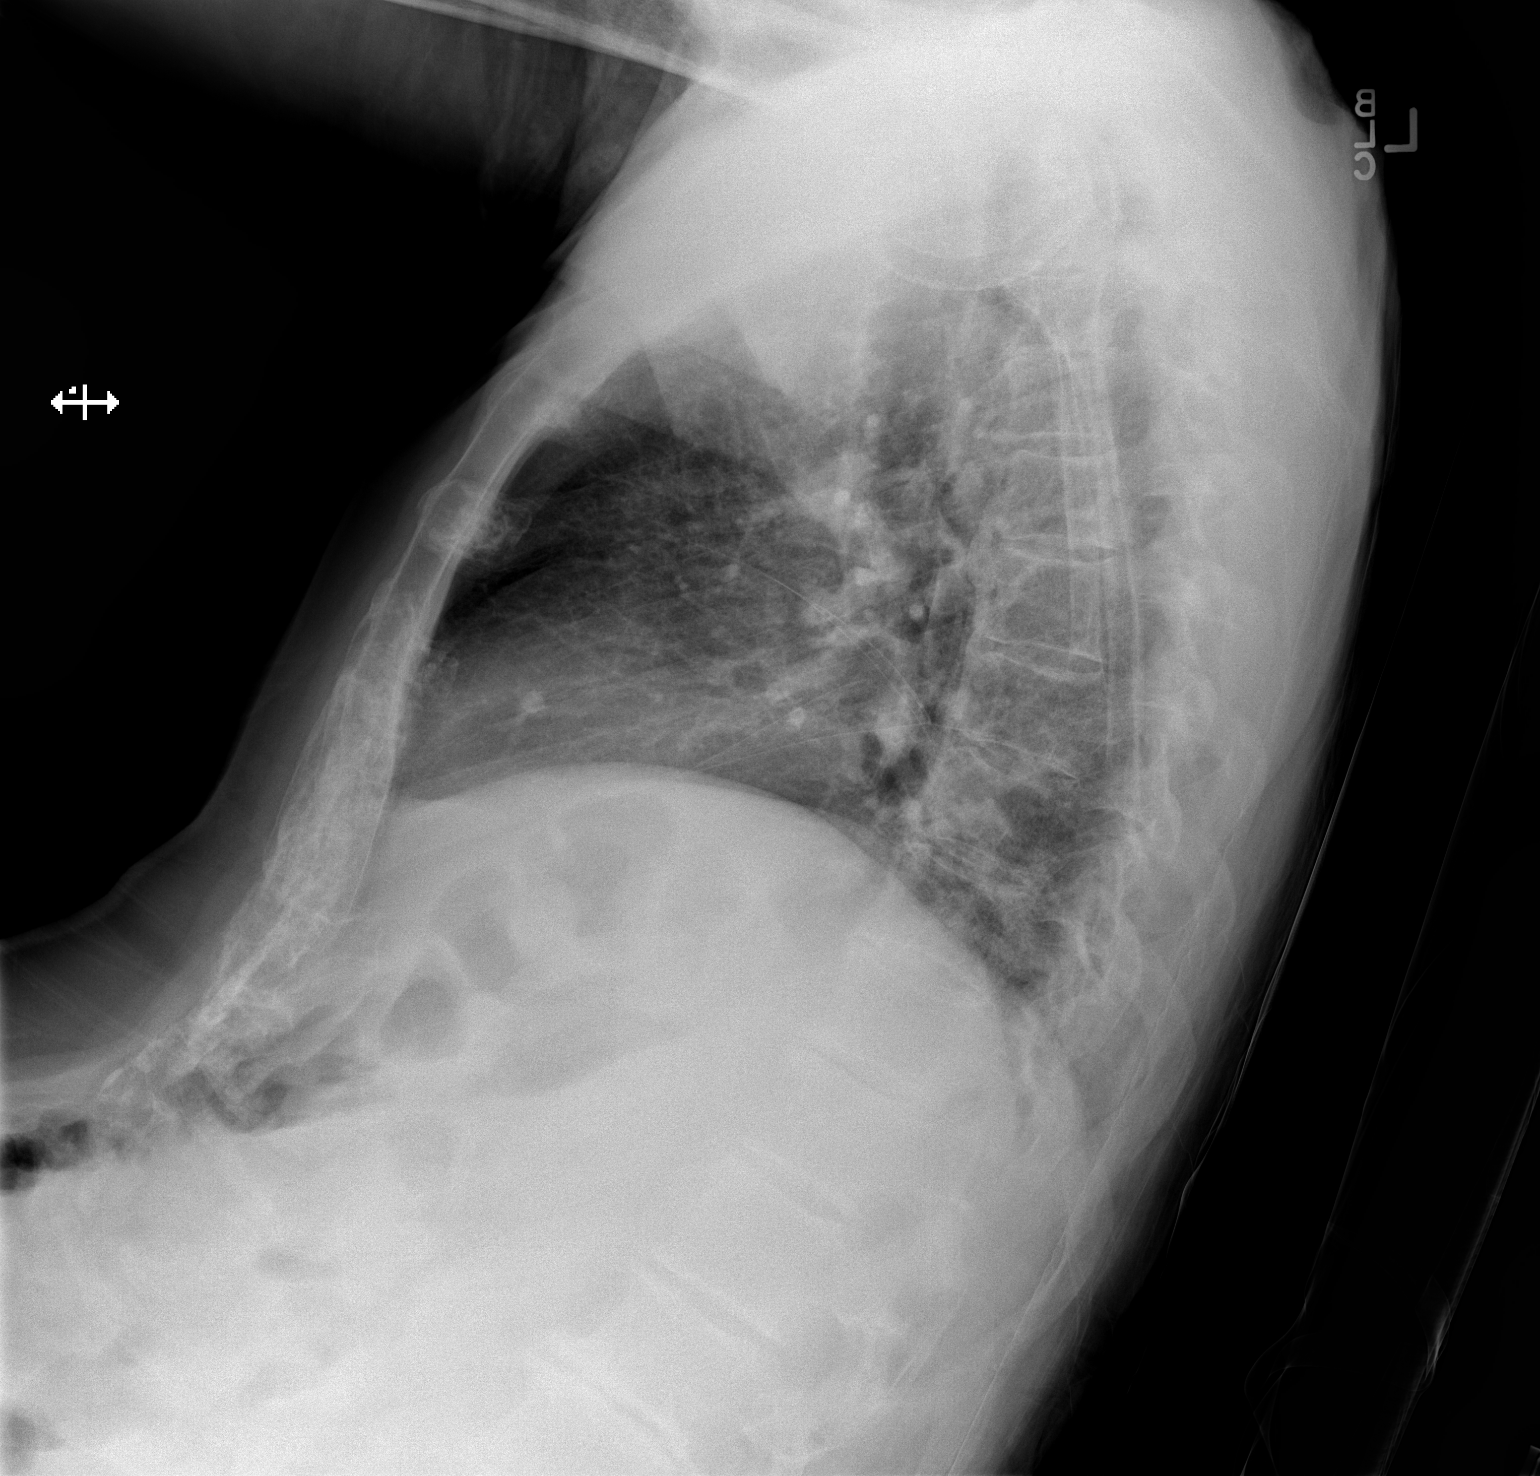

[x chest ap]
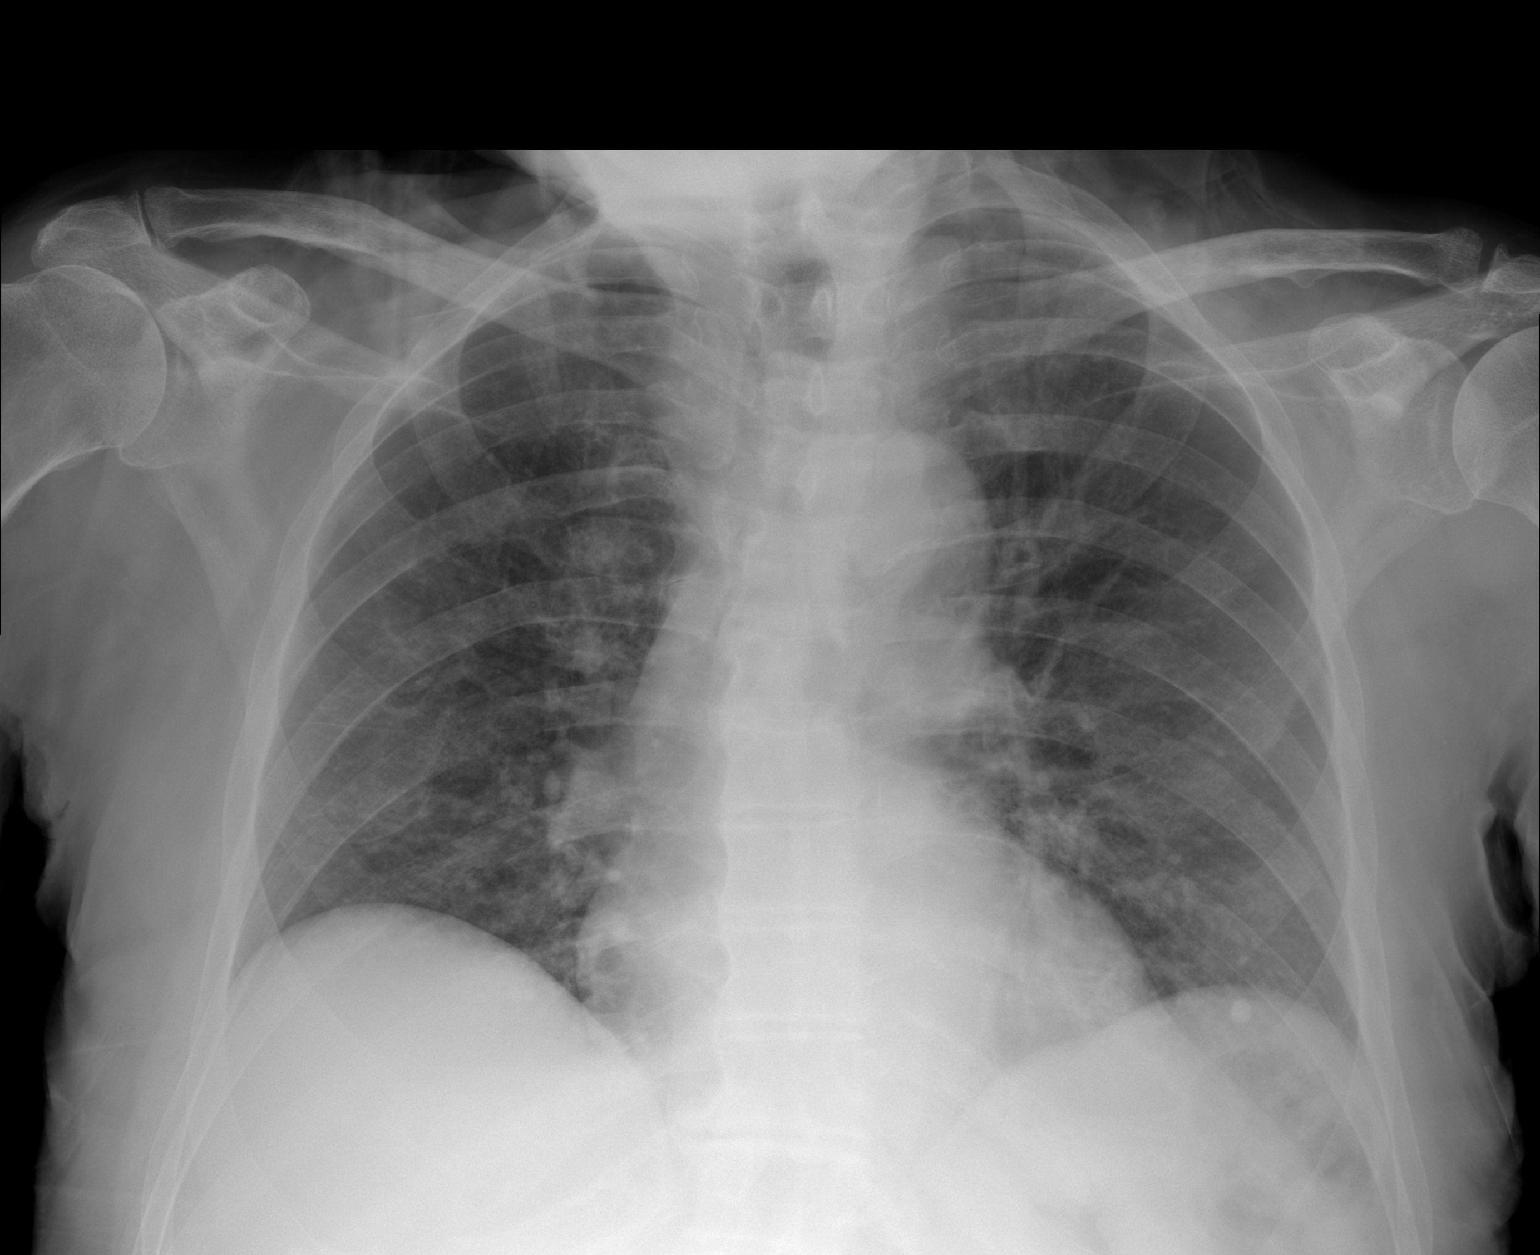

[2 of 2 positions shown; findings below may reference images not displayed]

FINDINGS: The heart size and pulmonary vascularity are normal and
the lungs are clear of acute infiltrates and effusions.  Calcified
granuloma at the left lung base.  No acute osseous abnormality.
IMPRESSION: No acute disease in the chest.

## 2015-01-01 IMAGING — CT CT CERVICAL SPINE W/O CM
3 series · 15 of 27 positions shown, 18 images · non-contrast
Comparison: 02/13/2013.

CLINICAL DATA: Follow-up cervical fracture.

CT CERVICAL SPINE WITHOUT CONTRAST
TECHNIQUE: Multidetector CT imaging of the cervical spine was
performed. Multiplanar CT image reconstructions were also
generated.

[Series 2: c spine bone · axial · 0.37mm/px · z∈[+1,+113]mm · 5 of 69 slices shown, 7 images]
[im 12/69  soft-tissue]
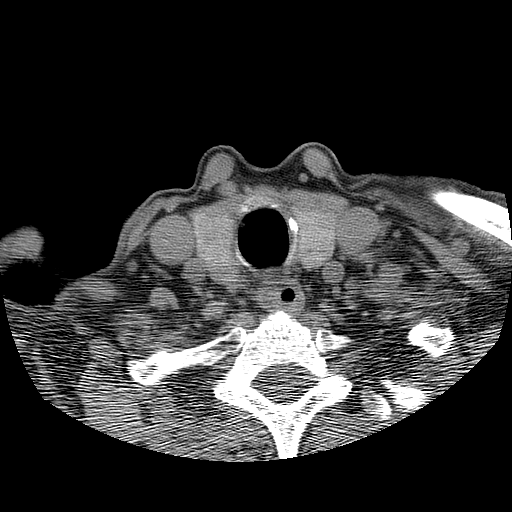
[im 12/69  bone]
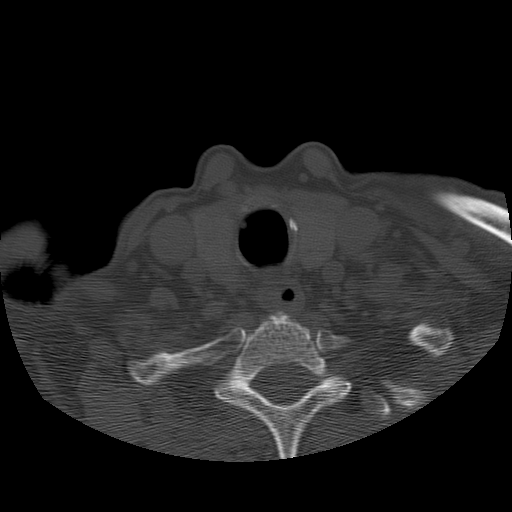
[im 23/69  bone]
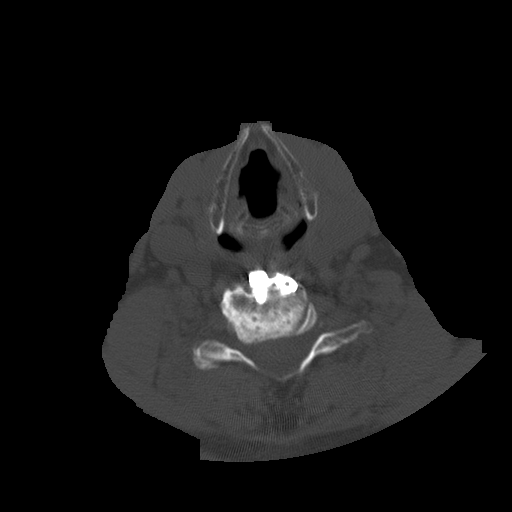
[im 35/69  bone]
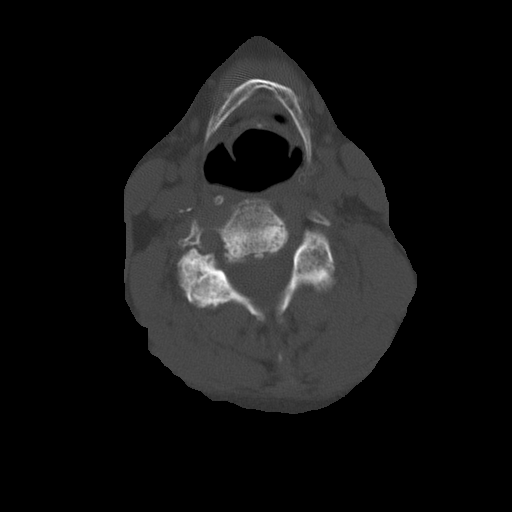
[im 46/69  bone]
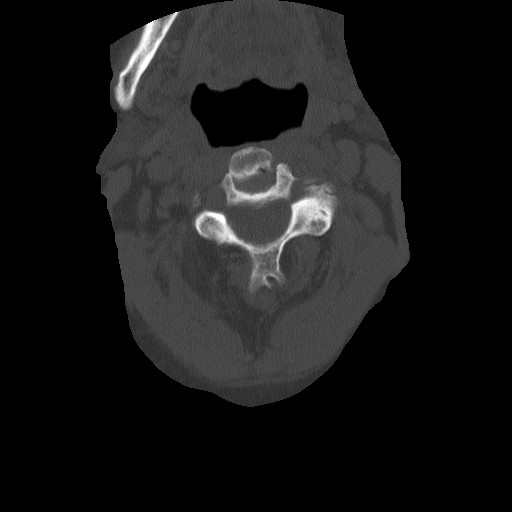
[im 57/69  soft-tissue]
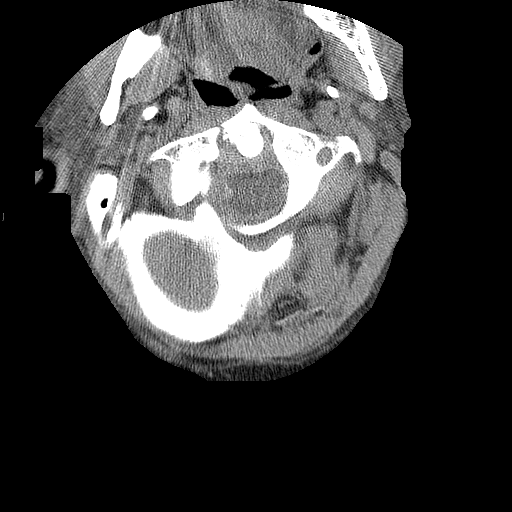
[im 57/69  bone]
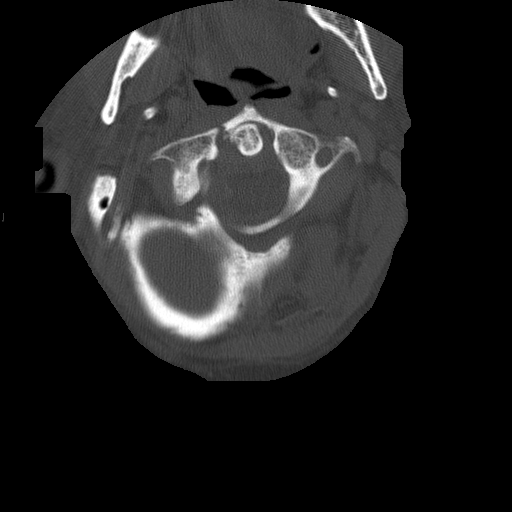

[Series 3: c spine soft · axial · 0.37mm/px · z∈[+1,+113]mm · 5 of 69 slices shown]
[im 12/69  soft-tissue]
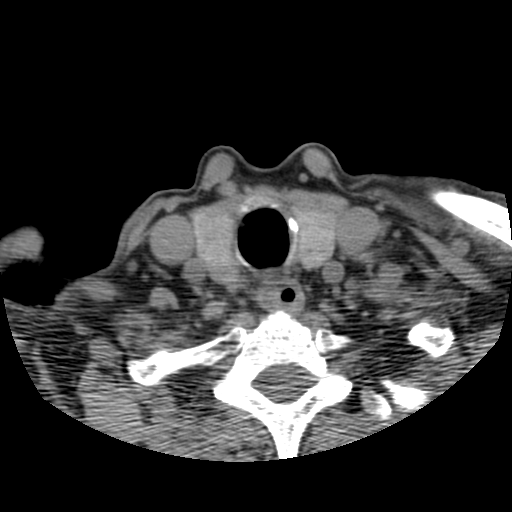
[im 23/69  soft-tissue]
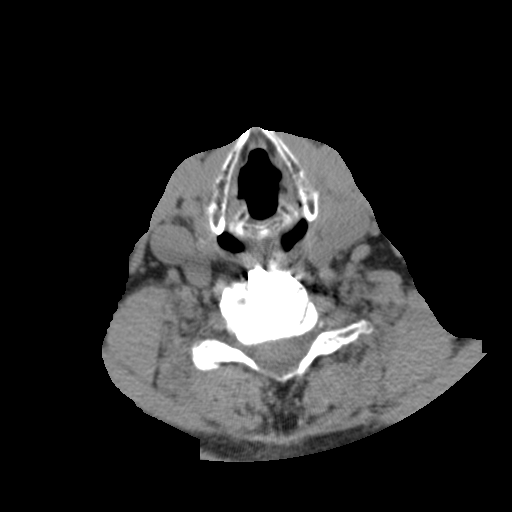
[im 35/69  soft-tissue]
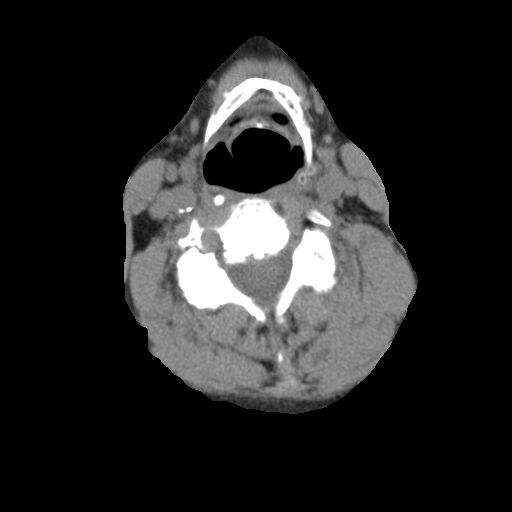
[im 46/69  soft-tissue]
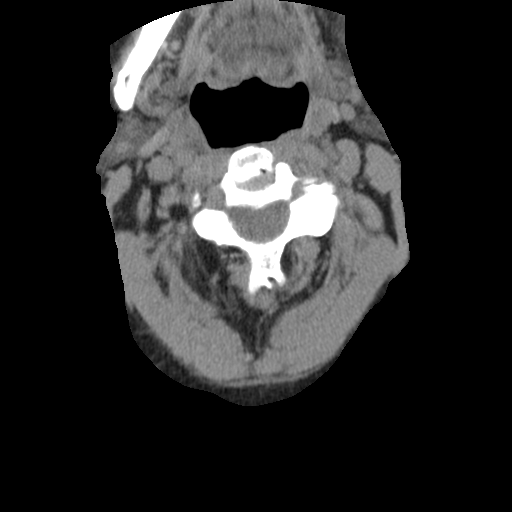
[im 57/69  soft-tissue]
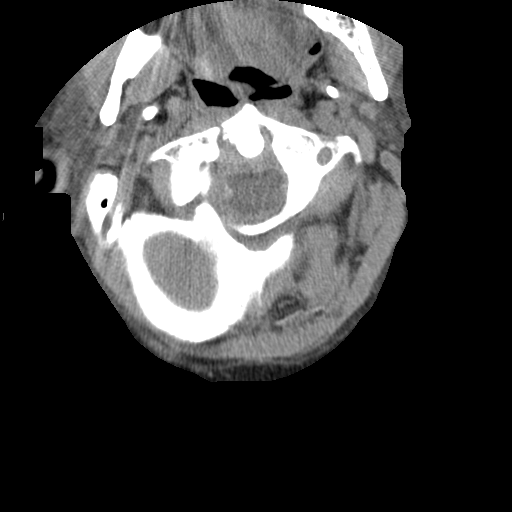

[Series 400: sagittal · sagittal · 0.37mm/px · 5 of 50 slices shown, 6 images]
[im 17/50  bone]
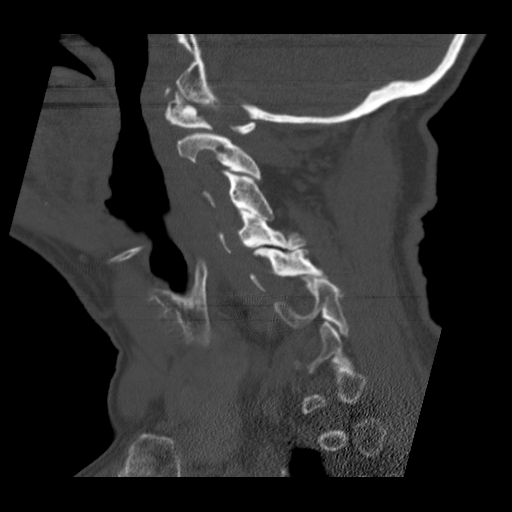
[im 21/50  bone]
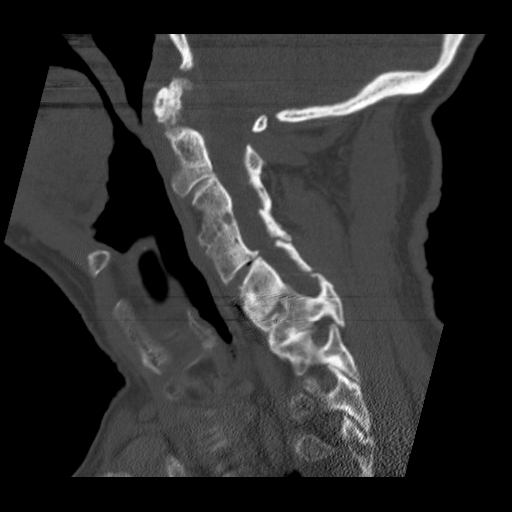
[im 25/50  soft-tissue]
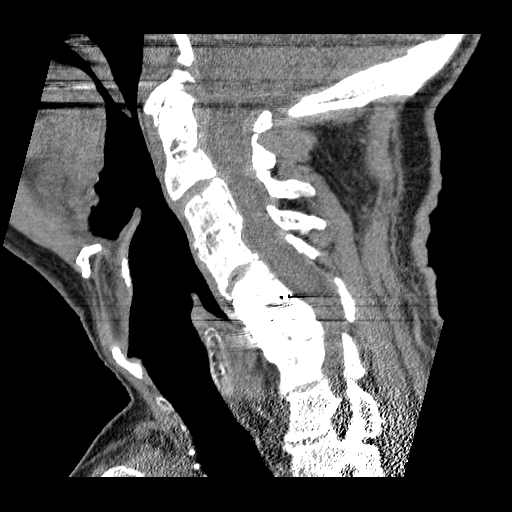
[im 25/50  bone]
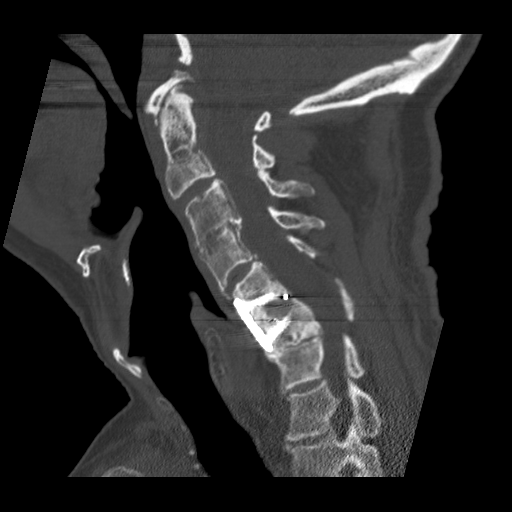
[im 29/50  bone]
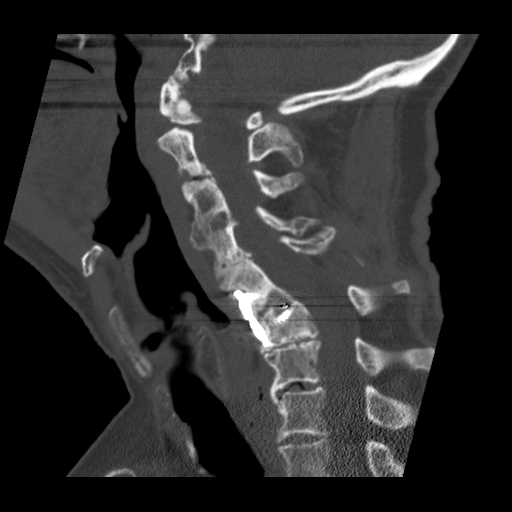
[im 33/50  bone]
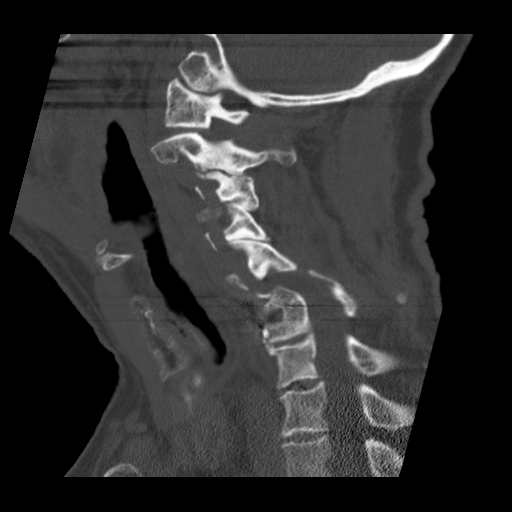

[15 of 27 positions shown; findings below may reference images not displayed]

FINDINGS: There has been no interval healing of the right-sided
anterior and posterior C1 arch fractures.  Moderate widening of the
anterior arch fracture measuring 3 mm.  Slight widening of the
posterior arch fracture measures 2 mm.  Slight lateral displacement
of the right C1 lateral mass on C2.  Solid fusion C3-C4.  Solid
fusion C5-C6.  Severe degenerative disc disease C6-C7.

Lung apices clear.  Carotid calcification.
IMPRESSION: Unchanged right C1 anterior and posterior arch fractures.  No
interval healing.  Unchanged mild lateral displacement C1 lateral
mass.

## 2015-07-18 ENCOUNTER — Encounter (HOSPITAL_COMMUNITY): Payer: Self-pay | Admitting: Emergency Medicine

## 2015-07-18 ENCOUNTER — Emergency Department (HOSPITAL_COMMUNITY)
Admission: EM | Admit: 2015-07-18 | Discharge: 2015-07-18 | Disposition: A | Discharge status: Home / Self Care | Payer: Medicare Other | Attending: Emergency Medicine | Admitting: Emergency Medicine

## 2015-07-18 ENCOUNTER — Emergency Department (HOSPITAL_COMMUNITY): Payer: Medicare Other

## 2015-07-18 DIAGNOSIS — Y92000 Kitchen of unspecified non-institutional (private) residence as  the place of occurrence of the external cause: Secondary | ICD-10-CM | POA: Insufficient documentation

## 2015-07-18 DIAGNOSIS — I509 Heart failure, unspecified: Secondary | ICD-10-CM | POA: Insufficient documentation

## 2015-07-18 DIAGNOSIS — G20 Parkinson's disease: Secondary | ICD-10-CM | POA: Diagnosis not present

## 2015-07-18 DIAGNOSIS — Y9389 Activity, other specified: Secondary | ICD-10-CM | POA: Insufficient documentation

## 2015-07-18 DIAGNOSIS — S0101XA Laceration without foreign body of scalp, initial encounter: Secondary | ICD-10-CM | POA: Insufficient documentation

## 2015-07-18 DIAGNOSIS — F419 Anxiety disorder, unspecified: Secondary | ICD-10-CM | POA: Insufficient documentation

## 2015-07-18 DIAGNOSIS — S0990XA Unspecified injury of head, initial encounter: Secondary | ICD-10-CM | POA: Diagnosis present

## 2015-07-18 DIAGNOSIS — W01198A Fall on same level from slipping, tripping and stumbling with subsequent striking against other object, initial encounter: Secondary | ICD-10-CM | POA: Insufficient documentation

## 2015-07-18 DIAGNOSIS — R011 Cardiac murmur, unspecified: Secondary | ICD-10-CM | POA: Diagnosis not present

## 2015-07-18 DIAGNOSIS — Z7982 Long term (current) use of aspirin: Secondary | ICD-10-CM | POA: Diagnosis not present

## 2015-07-18 DIAGNOSIS — Z79899 Other long term (current) drug therapy: Secondary | ICD-10-CM | POA: Diagnosis not present

## 2015-07-18 DIAGNOSIS — E039 Hypothyroidism, unspecified: Secondary | ICD-10-CM | POA: Diagnosis not present

## 2015-07-18 DIAGNOSIS — Z8546 Personal history of malignant neoplasm of prostate: Secondary | ICD-10-CM | POA: Diagnosis not present

## 2015-07-18 DIAGNOSIS — Y998 Other external cause status: Secondary | ICD-10-CM | POA: Insufficient documentation

## 2015-07-18 DIAGNOSIS — K219 Gastro-esophageal reflux disease without esophagitis: Secondary | ICD-10-CM | POA: Diagnosis not present

## 2015-07-18 DIAGNOSIS — Z87891 Personal history of nicotine dependence: Secondary | ICD-10-CM | POA: Insufficient documentation

## 2015-07-18 DIAGNOSIS — I10 Essential (primary) hypertension: Secondary | ICD-10-CM | POA: Diagnosis not present

## 2015-07-18 NOTE — ED Notes (Addendum)
Per EMS patient tripped and hit his head on a corner of a wall. Wife witnessed fall and did not lose consciousness. Denies use of blood thinners. Hematoma with laceration back of head; bleeding controlled. C-collar placed by EMS Hx of dementia, mumbled speech is normal for patient. Patient from home.

## 2015-07-18 NOTE — ED Notes (Signed)
Pt has small hematoma to posterior right head, bleeding controlled at this time. Pt denies pain, no tenderness upon palpation, c-collar in place.

## 2015-07-18 NOTE — ED Notes (Signed)
Bed: WA17 Expected date:  Expected time:  Means of arrival:  Comments: EMS/21M/fall

## 2015-07-18 NOTE — Discharge Instructions (Signed)
Fall Prevention in the Home  Falls can cause injuries and can affect people from all age groups. There are many simple things that you can do to make your home safe and to help prevent falls. WHAT CAN I DO ON THE OUTSIDE OF MY HOME?  Regularly repair the edges of walkways and driveways and fix any cracks.  Remove high doorway thresholds.  Trim any shrubbery on the main path into your home.  Use bright outdoor lighting.  Clear walkways of debris and clutter, including tools and rocks.  Regularly check that handrails are securely fastened and in good repair. Both sides of any steps should have handrails.  Install guardrails along the edges of any raised decks or porches.  Have leaves, snow, and ice cleared regularly.  Use sand or salt on walkways during winter months.  In the garage, clean up any spills right away, including grease or oil spills. WHAT CAN I DO IN THE BATHROOM?  Use night lights.  Install grab bars by the toilet and in the tub and shower. Do not use towel bars as grab bars.  Use non-skid mats or decals on the floor of the tub or shower.  If you need to sit down while you are in the shower, use a plastic, non-slip stool.[place] Kitchen  Keep the floor dry. Immediately clean up any water that spills on the floor.  Remove soap buildup in the tub or shower on a regular basis.  Attach bath mats securely with double-sided non-slip rug tape.  Remove throw rugs and other tripping hazards from the floor. WHAT CAN I DO IN THE BEDROOM?  Use night lights.  Make sure that a bedside light is easy to reach.  Do not use oversized bedding that drapes onto the floor.  Have a firm chair that has side arms to use for getting dressed.  Remove throw rugs and other tripping hazards from the floor. WHAT CAN I DO IN THE KITCHEN?   Clean up any spills right away.  Avoid walking on wet floors.  Place frequently used items in easy-to-reach places.  If you need to reach for something  above you, use a sturdy step stool that has a grab bar.  Keep electrical cables out of the way.  Do not use floor polish or wax that makes floors slippery. If you have to use wax, make sure that it is non-skid floor wax.  Remove throw rugs and other tripping hazards from the floor. WHAT CAN I DO IN THE STAIRWAYS?  Do not leave any items on the stairs.  Make sure that there are handrails on both sides of the stairs. Fix handrails that are broken or loose. Make sure that handrails are as long as the stairways.  Check any carpeting to make sure that it is firmly attached to the stairs. Fix any carpet that is loose or worn.  Avoid having throw rugs at the top or bottom of stairways, or secure the rugs with carpet tape to prevent them from moving.  Make sure that you have a light switch at the top of the stairs and the bottom of the stairs. If you do not have them, have them installed. WHAT ARE SOME OTHER FALL PREVENTION TIPS?  Wear closed-toe shoes that fit well and support your feet. Wear shoes that have rubber soles or low heels.  When you use a stepladder, make sure that it is completely opened and that the sides are firmly locked. Have someone hold the ladder while you  are using it. Do not climb a closed stepladder.  Add color or contrast paint or tape to grab bars and handrails in your home. Place contrasting color strips on the first and last steps.  Use mobility aids as needed, such as canes, walkers, scooters, and crutches.  Turn on lights if it is dark. Replace any light bulbs that burn out.  Set up furniture so that there are clear paths. Keep the furniture in the same spot.  Fix any uneven floor surfaces.  Choose a carpet design that does not hide the edge of steps of a stairway.  Be aware of any and all pets.  Review your medicines with your healthcare provider. Some medicines can cause dizziness or changes in blood pressure, which increase your risk of falling. Talk  with your health care provider about other ways that you can decrease your risk of falls. This may include working with a physical therapist or trainer to improve your strength, balance, and endurance.   This information is not intended to replace advice given to you by your health care provider. Make sure you discuss any questions you have with your health care provider.   Document Released: 09/05/2002 Document Revised: 01/30/2015 Document Reviewed: 10/20/2014 Elsevier Interactive Patient Education 2016 [place] Injury, Adult You have received a head injury. It does not appear serious at this time. Headaches and vomiting are common following head injury. It should be easy to awaken from sleeping. Sometimes it is necessary for you to stay in the emergency department for a while for observation. Sometimes admission to the hospital may be needed. After injuries such as yours, most problems occur within the first 24 hours, but side effects may occur up to 7-10 days after the injury. It is important for you to carefully monitor your condition and contact your health care provider or seek immediate medical care if there is a change in your condition. WHAT ARE THE TYPES OF HEAD INJURIES? Head injuries can be as minor as a bump. Some head injuries can be more severe. More severe head injuries include:  A jarring injury to the brain (concussion).  A bruise of the brain (contusion). This mean there is bleeding in the brain that can cause swelling.  A cracked skull (skull fracture).  Bleeding in the brain that collects, clots, and forms a bump (hematoma). WHAT CAUSES A HEAD INJURY? A serious head injury is most likely to happen to someone who is in a car wreck and is not wearing a seat belt. Other causes of major head injuries include bicycle or motorcycle accidents, sports injuries, and falls. HOW ARE HEAD INJURIES DIAGNOSED? A complete history of the event leading to the injury and your  current symptoms will be helpful in diagnosing head injuries. Many times, pictures of the brain, such as CT or MRI are needed to see the extent of the injury. Often, an overnight hospital stay is necessary for observation.  WHEN SHOULD I SEEK IMMEDIATE MEDICAL CARE?  You should get help right away if:  You have confusion or drowsiness.  You feel sick to your stomach (nauseous) or have continued, forceful vomiting.  You have dizziness or unsteadiness that is getting worse.  You have severe, continued headaches not relieved by medicine. Only take over-the-counter or prescription medicines for pain, fever, or discomfort as directed by your health care provider.  You do not have normal function of the arms or legs or are unable to walk.  You notice changes  in the black spots in the center of the colored part of your eye (pupil).  You have a clear or bloody fluid coming from your nose or ears.  You have a loss of vision. During the next 24 hours after the injury, you must stay with someone who can watch you for the warning signs. This person should contact local emergency services (911 in the [place] if you have seizures, you become unconscious, or you are unable to wake up. HOW CAN I PREVENT A HEAD INJURY IN THE FUTURE? The most important factor for preventing major head injuries is avoiding motor vehicle accidents. To minimize the potential for damage to your head, it is crucial to wear seat belts while riding in motor vehicles. Wearing helmets while bike riding and playing collision sports (like football) is also helpful. Also, avoiding dangerous activities around the house will further help reduce your risk of head injury.  WHEN CAN I RETURN TO NORMAL ACTIVITIES AND ATHLETICS? You should be reevaluated by your health care provider before returning to these activities. If you have any of the following symptoms, you should not return to activities or contact sports until 1 week after the symptoms  have stopped:  Persistent headache.  Dizziness or vertigo.  Poor attention and concentration.  Confusion.  Memory problems.  Nausea or vomiting.  Fatigue or tire easily.  Irritability.  Intolerant of bright lights or loud noises.  Anxiety or depression.  Disturbed sleep. MAKE SURE YOU:   Understand these instructions.  Will watch your condition.  Will get help right away if you are not doing well or get worse.   This information is not intended to replace advice given to you by your health care provider. Make sure you discuss any questions you have with your health care provider.   Document Released: 09/15/2005 Document Revised: 10/06/2014 Document Reviewed: 05/23/2013 Elsevier Interactive Patient Education [employer].

## 2015-07-18 NOTE — ED Provider Notes (Signed)
CSN: [id number]     Arrival date & time 07/18/15  1906 History   First MD Initiated Contact with Patient 07/18/15 1926     Chief Complaint  Patient presents with  . Fall     (Consider location/radiation/quality/duration/timing/severity/associated sxs/prior Treatment) HPI Comments: 81 y.o. Male with history of Parkinson's disease, dementia, HTN, unsteady gait presents following a fall.  The patient was in the kitchen with his wife who was cooking dinner and he turned around hitting his head against a wall and falling to the ground.  The patient did not lose consciousness.  He has been at his neurologic baseline since per his wife.  The wife was present at the time of the fall.  Patient is very unsteady on his feet at baseline and that wife cares for the patient at home and follows him around the house to try to prevent falls.    Patient is a 81 y.o. male presenting with fall.  Fall    Past Medical History  Diagnosis Date  . Parkinson disease ([place])   . GERD (gastroesophageal reflux disease)   . Hypertension   . Hypothyroidism     takes meds,  . Cancer [facility])     prostate  . Neuromuscular disorder ([place])     PARKINSONS  . Anxiety   . RBBB (right bundle branch block)   . CHF (congestive heart failure) ([place])   . Dyspnea on exertion   . Aortic valve disorder    Past Surgical History  Procedure Laterality Date  . Prostate surgery    . Appendectomy    . Cervical discectomy  02/18/2012     C 5 C6    . Anterior cervical decomp/discectomy fusion  02/18/2012    Procedure: ANTERIOR CERVICAL DECOMPRESSION/DISCECTOMY FUSION 1 LEVEL;  Surgeon: Floyce Stakes, MD;  Location: [facility];  Service: Neurosurgery;  Laterality: N/A;  Cervical five-six Anterior cervical decompression/diskectomy, fusion, plate  . Persantine myoview stress test  08/27/2010    Normal myocardial perfusion study. No EKG changes.  . 2d echocardiogram  09/01/2011    EF >55%, normal-mild   Family History  Problem  Relation Age of Onset  . Anesthesia problems Neg Hx   . Hypotension Neg Hx   . Malignant hyperthermia Neg Hx   . Pseudochol deficiency Neg Hx   . Kidney disease Mother   . Heart disease Father   . Heart attack Brother    Social History  Substance Use Topics  . Smoking status: Former Smoker -- 1.00 packs/day for 25 years  . Smokeless tobacco: Current User    Types: Chew  . Alcohol Use: No    Review of Systems  Unable to perform ROS: Dementia      Allergies  Adhesive and Lasix  Home Medications   Prior to Admission medications   Medication Sig Start Date End Date Taking? Authorizing Provider  aspirin 81 MG tablet Take 81 mg by mouth daily.     Historical Provider, MD  carbidopa-levodopa (SINEMET IR) 25-100 MG per tablet Take 1 tablet by mouth 4 (four) times daily.    Historical Provider, MD  ibuprofen (ADVIL,MOTRIN) 200 MG tablet Take 200 mg by mouth 3 (three) times daily.    Historical Provider, MD  levothyroxine (SYNTHROID, LEVOTHROID) 25 MCG tablet Take 25 mcg by mouth daily.    Historical Provider, MD  LORazepam (ATIVAN) 0.5 MG tablet Take 1 tablet (0.5 mg total) by mouth every 8 (eight) hours as needed for anxiety. 11/15/14  Daleen Bo, MD  Melatonin 10 MG CAPS Take 10 mg by mouth at bedtime.    Historical Provider, MD  QUEtiapine (SEROQUEL) 50 MG tablet Take 100 mg by mouth at bedtime.     Historical Provider, MD  tamsulosin (FLOMAX) 0.4 MG CAPS Take 0.4 mg by mouth daily after supper.    Historical Provider, MD  tobramycin-dexamethasone [facility]) ophthalmic solution Place 1 drop into both eyes 2 (two) times daily as needed (dry eyes).    Historical Provider, MD  vitamin B-12 ([facility] VITAMIN B12) 250 MCG tablet Take 250 mcg by mouth daily.    Historical Provider, MD   BP 109/54 mmHg  Pulse 83  Temp(Src) 97.6 F (36.4 C) (Oral)  Resp 18  SpO2 95% Physical Exam  Constitutional: No distress.  HENT:  Head: Normocephalic. Head is with contusion (posterior scalp) and  with laceration (small on posterior scalp without active bleeding and does not appear to need closure).  Right Ear: External ear normal.  Left Ear: External ear normal.  Mouth/Throat: Oropharynx is clear and moist. No oropharyngeal exudate.  Eyes: EOM are normal. Pupils are equal, round, and reactive to light.  Neck: No spinous process tenderness present.  Cervical collar in place  Cardiovascular: Normal rate, regular rhythm and intact distal pulses.   Murmur heard. Pulmonary/Chest: Effort normal. No respiratory distress. He has no wheezes. He has no rales. He exhibits no tenderness.  Abdominal: Soft. He exhibits no distension and no mass. There is no tenderness.  Musculoskeletal:  No pain or tenderness on range of motion of all four extremities.  Neurological:  Patient moving all extremities, following simple commands  Skin: Skin is warm and dry. He is not diaphoretic.  Vitals reviewed.   ED Course  Procedures (including critical care time) Labs Review Labs Reviewed - No data to display  Imaging Review No results found. I have personally reviewed and evaluated these images and lab results as part of my medical decision-making.   EKG Interpretation None      MDM  Patient seen and evaluated in stable condition.  Witnessed mechanical fall.  Head injury but no sign of other traumatic injury.  CT head and cervical spine negative for acute process.  Patient discharged home in stable condition in the care of his wife. Final diagnoses:  None    1. Head injury    Harvel Quale, MD 07/19/15 [phone]

## 2015-10-10 ENCOUNTER — Encounter: Payer: Self-pay | Admitting: Cardiovascular Disease

## 2015-10-10 ENCOUNTER — Telehealth: Payer: Self-pay | Admitting: Cardiovascular Disease

## 2015-10-10 NOTE — Telephone Encounter (Signed)
Please call,pt needs clearance for skin cancer surgery. His ear lobe will be removed.

## 2015-10-10 NOTE — Telephone Encounter (Signed)
Letter in epic

## 2015-10-10 NOTE — Telephone Encounter (Signed)
Faxed to number provided. This is a listed number for [facility]. Dr. Carlis Abbott is at a separate clinic. Included note requesting call back from clinic if 2nd note needs to be faxed elsewhere.  Left message for patient to call.

## 2015-10-10 NOTE — Telephone Encounter (Addendum)
Request for surgical clearance:  1. What type of surgery is being performed?  Removal of ear lobe - d/t skin CA spreading  2. When is this surgery scheduled? TBD - asap  3. Are there any medications that need to be held prior to surgery and how long?   4. Name of physician performing surgery? Dr. Janalee Dane  5. What is your office phone and fax number? Fax: [phone]

## 2016-10-30 DEATH — deceased
